# Patient Record
Sex: Female | Born: 1970 | Race: Black or African American | Hispanic: No | Marital: Single | Smoking: Never smoker
Health system: Southern US, Community
[De-identification: ages and names within clinical notes are randomized; demographics above are authoritative.]

## PROBLEM LIST (undated history)

## (undated) ENCOUNTER — Ambulatory Visit: Discharge status: Absent without leave | Payer: 59

## (undated) DIAGNOSIS — IMO0002 Reserved for concepts with insufficient information to code with codable children: Secondary | ICD-10-CM

## (undated) DIAGNOSIS — A630 Anogenital (venereal) warts: Secondary | ICD-10-CM

## (undated) DIAGNOSIS — N750 Cyst of Bartholin's gland: Secondary | ICD-10-CM

## (undated) DIAGNOSIS — K589 Irritable bowel syndrome without diarrhea: Secondary | ICD-10-CM

## (undated) DIAGNOSIS — L709 Acne, unspecified: Secondary | ICD-10-CM

## (undated) HISTORY — DX: Reserved for concepts with insufficient information to code with codable children: IMO0002

## (undated) HISTORY — DX: Irritable bowel syndrome, unspecified: K58.9

## (undated) HISTORY — DX: Anogenital (venereal) warts: A63.0

## (undated) HISTORY — DX: Cyst of Bartholin's gland: N75.0

## (undated) HISTORY — DX: Acne, unspecified: L70.9

---

## 1997-11-02 HISTORY — PX: TUBAL LIGATION: SHX77

## 1998-05-22 ENCOUNTER — Encounter: Admission: RE | Admit: 1998-05-22 | Discharge: 1998-05-22 | Payer: Self-pay | Admitting: *Deleted

## 2003-11-03 HISTORY — PX: ENDOMETRIAL ABLATION: SHX621

## 2004-03-21 ENCOUNTER — Ambulatory Visit (HOSPITAL_COMMUNITY): Admission: RE | Admit: 2004-03-21 | Discharge: 2004-03-21 | Payer: Self-pay | Admitting: Obstetrics & Gynecology

## 2004-03-21 ENCOUNTER — Other Ambulatory Visit: Admission: RE | Admit: 2004-03-21 | Discharge: 2004-03-21 | Payer: Self-pay | Admitting: Obstetrics & Gynecology

## 2006-04-06 ENCOUNTER — Other Ambulatory Visit: Admission: RE | Admit: 2006-04-06 | Discharge: 2006-04-06 | Payer: Self-pay | Admitting: Gynecology

## 2007-04-12 ENCOUNTER — Other Ambulatory Visit: Admission: RE | Admit: 2007-04-12 | Discharge: 2007-04-12 | Payer: Self-pay | Admitting: Gynecology

## 2007-08-01 ENCOUNTER — Emergency Department (HOSPITAL_COMMUNITY): Admission: EM | Admit: 2007-08-01 | Discharge: 2007-08-01 | Payer: Self-pay | Admitting: Emergency Medicine

## 2007-09-03 DIAGNOSIS — A630 Anogenital (venereal) warts: Secondary | ICD-10-CM

## 2007-09-03 HISTORY — DX: Anogenital (venereal) warts: A63.0

## 2007-10-21 ENCOUNTER — Other Ambulatory Visit: Admission: RE | Admit: 2007-10-21 | Discharge: 2007-10-21 | Payer: Self-pay | Admitting: Gynecology

## 2008-03-15 HISTORY — PX: FLEXIBLE SIGMOIDOSCOPY: SHX1649

## 2008-04-13 ENCOUNTER — Other Ambulatory Visit: Admission: RE | Admit: 2008-04-13 | Discharge: 2008-04-13 | Payer: Self-pay | Admitting: Gynecology

## 2008-04-22 ENCOUNTER — Emergency Department (HOSPITAL_COMMUNITY): Admission: EM | Admit: 2008-04-22 | Discharge: 2008-04-22 | Payer: Self-pay | Admitting: Emergency Medicine

## 2008-08-17 ENCOUNTER — Ambulatory Visit: Payer: Self-pay | Admitting: Gynecology

## 2008-10-18 ENCOUNTER — Other Ambulatory Visit: Admission: RE | Admit: 2008-10-18 | Discharge: 2008-10-18 | Payer: Self-pay | Admitting: Gynecology

## 2008-10-18 ENCOUNTER — Ambulatory Visit: Payer: Self-pay | Admitting: Gynecology

## 2009-04-17 ENCOUNTER — Ambulatory Visit: Payer: Self-pay | Admitting: Gynecology

## 2009-04-17 ENCOUNTER — Encounter: Payer: Self-pay | Admitting: Gynecology

## 2009-04-17 ENCOUNTER — Other Ambulatory Visit: Admission: RE | Admit: 2009-04-17 | Discharge: 2009-04-17 | Payer: Self-pay | Admitting: Gynecology

## 2009-05-20 ENCOUNTER — Ambulatory Visit: Payer: Self-pay | Admitting: Gynecology

## 2010-04-02 DIAGNOSIS — IMO0002 Reserved for concepts with insufficient information to code with codable children: Secondary | ICD-10-CM

## 2010-04-02 DIAGNOSIS — R87619 Unspecified abnormal cytological findings in specimens from cervix uteri: Secondary | ICD-10-CM

## 2010-04-02 HISTORY — DX: Reserved for concepts with insufficient information to code with codable children: IMO0002

## 2010-04-02 HISTORY — DX: Unspecified abnormal cytological findings in specimens from cervix uteri: R87.619

## 2010-04-22 ENCOUNTER — Other Ambulatory Visit: Admission: RE | Admit: 2010-04-22 | Discharge: 2010-04-22 | Payer: Self-pay | Admitting: Gynecology

## 2010-04-22 ENCOUNTER — Ambulatory Visit: Payer: Self-pay | Admitting: Gynecology

## 2011-03-20 NOTE — Op Note (Signed)
[name], [name]                           ACCOUNT NO.:  [id number]   MEDICAL RECORD NO.:  [id number]                   PATIENT TYPE:  AMB   LOCATION:  DAY                                  FACILITY:  [facility]   PHYSICIAN:  Lazaro Arms, M.D.                DATE OF BIRTH:  [birth year]   DATE OF PROCEDURE:  03/21/2004  DATE OF DISCHARGE:                                 OPERATIVE REPORT   PREOPERATIVE DIAGNOSES:  1. Menometrorrhagia.  2. Dysmenorrhea.   POSTOPERATIVE DIAGNOSES:  1. Menometrorrhagia.  2. Dysmenorrhea.   PROCEDURE:  1. Hysteroscopy.  2. Dilatation and curettage.  3. Endometrial ablation.   SURGEON:  Lazaro Arms, M.D.   ANESTHESIA:  General endotracheal anesthesia.   FINDINGS:  The patient has an essentially normal endometrial cavity.   DESCRIPTION OF OPERATION:  The patient was taken to the operating room and  placed in the supine position and underwent laryngeal mask airway, placed in  the dorsal lithotomy position and prepped and draped in the usual sterile  fashion.  A Graves speculum was placed, paracervical block placed. The  cervix was grasped with a single-tooth tenaculum. The cervix was dilated  serially to allow passage of the hysteroscope.  Hysteroscopy was performed  without difficulty.  There were no abnormalities of the endometrial cavity.   The hysteroscope was then removed.  The thermal ablation using the Gynecare  Thermachoice-3 was used.  It took 35 cc to maintain adequate fluid.  Total  therapy time was 12 minutes and 8 seconds.  The patient tolerated the  procedure well.  She experienced minimal blood loss; taken to the recovery  room in good stable condition.  She did have a small laceration from the  tenaculum on the left side and it was sutured with a figure-of-eight #0  Vicryl suture.  The patient tolerated the procedure well.      ___________________________________________                                            Lazaro Arms, M.D.   LHE/MEDQ  D:  03/21/2004  T:  03/21/2004  Job:  [id number]

## 2011-10-30 ENCOUNTER — Ambulatory Visit (INDEPENDENT_AMBULATORY_CARE_PROVIDER_SITE_OTHER): Payer: 59 | Admitting: Gynecology

## 2011-10-30 ENCOUNTER — Encounter: Payer: Self-pay | Admitting: Anesthesiology

## 2011-10-30 ENCOUNTER — Encounter: Payer: Self-pay | Admitting: Gynecology

## 2011-10-30 DIAGNOSIS — N898 Other specified noninflammatory disorders of vagina: Secondary | ICD-10-CM

## 2011-10-30 DIAGNOSIS — B373 Candidiasis of vulva and vagina: Secondary | ICD-10-CM

## 2011-10-30 DIAGNOSIS — Z113 Encounter for screening for infections with a predominantly sexual mode of transmission: Secondary | ICD-10-CM

## 2011-10-30 MED ORDER — FLUCONAZOLE 150 MG PO TABS: 150.0000 mg | ORAL_TABLET | Freq: Once | ORAL | Status: AC

## 2011-10-30 NOTE — Progress Notes (Signed)
Addended byCammie Mcgee T on: 10/30/2011 02:45 PM   Modules accepted: Orders

## 2011-10-30 NOTE — Progress Notes (Signed)
Patient presents with a one to two-week history of vaginal discharge. No odor or itching. Her partner was given a prescription for metronidazole to treat some unknown issue she did some reading was worried that she has an STD.  Exam with chaperone Elane Fritz present External BUS vagina with white discharge. Cervix normal. Uterus normal size midline mobile nontender. Adnexa without masses or tenderness.   Assessment and plan. Wet prep is positive for yeast. We'll treat with Diflucan 150x1 dose. GC Chlamydia screen was done. Reviewed serum STD screening and she wants to go ahead and do this I ordered an RPR, HIV, hepatitis B and hepatitis C. Patient will follow up with these results. In review of her chart her last exam here was June 2011. She reports having a checkup at [facility] in June 2012 with a normal Pap smear.

## 2011-10-30 NOTE — Patient Instructions (Signed)
Follow up for STD screening results. Take Diflucan as prescribed.

## 2011-11-02 LAB — GC/CHLAMYDIA PROBE AMP, GENITAL
Chlamydia, DNA Probe: NEGATIVE
GC Probe Amp, Genital: NEGATIVE

## 2011-11-04 ENCOUNTER — Telehealth: Payer: Self-pay | Admitting: *Deleted

## 2011-11-04 MED ORDER — TERCONAZOLE 0.4 % VA CREA: 1.0000 | TOPICAL_CREAM | Freq: Every day | VAGINAL | Status: AC

## 2011-11-04 NOTE — Telephone Encounter (Signed)
Patient informed rx sent in. 

## 2011-11-04 NOTE — Telephone Encounter (Signed)
Please call patient and informed GC/Chlamydia culture negative. Also call in Terazol 7 one applicator at bedtime x7. Call if no relief.

## 2011-11-04 NOTE — Telephone Encounter (Signed)
Pt calling c/o yeast infection still there. Pt was seen on 10/30/11 for same issue and given diflucan 150 mg #1. Pt having white discharge with itching. Pt would like something to relieve symptoms. Please advise

## 2012-02-24 ENCOUNTER — Telehealth: Payer: Self-pay | Admitting: *Deleted

## 2012-02-24 NOTE — Telephone Encounter (Signed)
Pt called c/o BV and yeast s/s, left message on pt voicemail to make appointment.

## 2012-04-26 ENCOUNTER — Ambulatory Visit (INDEPENDENT_AMBULATORY_CARE_PROVIDER_SITE_OTHER): Payer: 59 | Admitting: Gynecology

## 2012-04-26 ENCOUNTER — Other Ambulatory Visit (HOSPITAL_COMMUNITY)
Admission: RE | Admit: 2012-04-26 | Discharge: 2012-04-26 | Disposition: A | Discharge status: Home / Self Care | Payer: 59 | Source: Ambulatory Visit | Attending: Gynecology | Admitting: Gynecology

## 2012-04-26 ENCOUNTER — Encounter: Payer: Self-pay | Admitting: Gynecology

## 2012-04-26 VITALS — BP 120/74 | Ht 66.0 in | Wt 165.0 lb

## 2012-04-26 DIAGNOSIS — Z01419 Encounter for gynecological examination (general) (routine) without abnormal findings: Secondary | ICD-10-CM | POA: Insufficient documentation

## 2012-04-26 DIAGNOSIS — Z113 Encounter for screening for infections with a predominantly sexual mode of transmission: Secondary | ICD-10-CM

## 2012-04-26 DIAGNOSIS — Z1159 Encounter for screening for other viral diseases: Secondary | ICD-10-CM | POA: Insufficient documentation

## 2012-04-26 DIAGNOSIS — Z131 Encounter for screening for diabetes mellitus: Secondary | ICD-10-CM

## 2012-04-26 DIAGNOSIS — Z13220 Encounter for screening for lipoid disorders: Secondary | ICD-10-CM

## 2012-04-26 LAB — CBC WITH DIFFERENTIAL/PLATELET
Basophils Absolute: 0 10*3/uL (ref 0.0–0.1)
Basophils Relative: 0 % (ref 0–1)
Hemoglobin: 14.9 g/dL (ref 12.0–15.0)
MCHC: 34.7 g/dL (ref 30.0–36.0)
Neutro Abs: 6.3 10*3/uL (ref 1.7–7.7)
Neutrophils Relative %: 71 % (ref 43–77)
RDW: 13.5 % (ref 11.5–15.5)

## 2012-04-26 LAB — GLUCOSE, RANDOM: Glucose, Bld: 72 mg/dL (ref 70–99)

## 2012-04-26 LAB — LIPID PANEL: Cholesterol: 162 mg/dL (ref 0–200)

## 2012-04-26 NOTE — Patient Instructions (Signed)
Office will contact you with lab results. Follow up in one year for annual gynecologic exam.

## 2012-04-26 NOTE — Progress Notes (Signed)
MALANIE KOLOSKI 1971-03-28 [id number]        41 y.o.  for annual exam.  Doing well without complaints.  Past medical history,surgical history, medications, allergies, family history and social history were all reviewed and documented in the EPIC chart. ROS:  Was performed and pertinent positives and negatives are included in the history.  Exam: Sherrilyn Rist assistant present Filed Vitals:   04/26/12 1543  BP: 120/74   General appearance  Normal Skin grossly normal Head/Neck normal with no cervical or supraclavicular adenopathy thyroid normal Lungs  clear Cardiac RR, without RMG Abdominal  soft, nontender, without masses, organomegaly or hernia Breasts  examined lying and sitting without masses, retractions, discharge or axillary adenopathy. Pelvic  Ext/BUS/vagina  normal   Cervix  normal Pap/HPV  Uterus  anteverted, normal size, shape and contour, midline and mobile nontender   Adnexa  Without masses or tenderness    Anus and perineum  normal   Rectovaginal  normal sphincter tone without palpated masses or tenderness.    Assessment/Plan:  41 y.o. female for annual exam.   History of endometrial ablation was light regular menses status post tubal ligation. 1. Pap smear. Pap/HPV done today. 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 Pap smear 2009 LGSIL, follow up Pap 2009 negative, 2010 LGSIL, 2011 negative, no Pap 2012. If Pap/HPV negative today plan repeat in 1 year. If otherwise then we'll triage based on results. 2. Mammography. Patient planning baseline mammogram by age 46.  SBE monthly reviewed. 3. HIV screening. Patient requests HIV screening. No known exposure but wants to be screened. Offered hepatitis B hepatitis C RPR and she declined. Recent GC Chlamydia screen in December negative, reoffered now she declined. 4. Health maintenance. Baseline CBC glucose lipid profile urinalysis ordered. Assuming she continues well from a gynecologic standpoint she will see me in a year, sooner as  needed.    Dara Lords MD, 4:44 PM 04/26/2012

## 2012-04-27 LAB — URINALYSIS W MICROSCOPIC + REFLEX CULTURE
Bilirubin Urine: NEGATIVE
Crystals: NONE SEEN
Glucose, UA: NEGATIVE mg/dL
Specific Gravity, Urine: 1.006 (ref 1.005–1.030)
Squamous Epithelial / LPF: NONE SEEN

## 2012-07-20 ENCOUNTER — Ambulatory Visit (INDEPENDENT_AMBULATORY_CARE_PROVIDER_SITE_OTHER): Payer: 59 | Admitting: Women's Health

## 2012-07-20 ENCOUNTER — Encounter: Payer: Self-pay | Admitting: Women's Health

## 2012-07-20 DIAGNOSIS — Z9889 Other specified postprocedural states: Secondary | ICD-10-CM | POA: Insufficient documentation

## 2012-07-20 DIAGNOSIS — N898 Other specified noninflammatory disorders of vagina: Secondary | ICD-10-CM

## 2012-07-20 DIAGNOSIS — N879 Dysplasia of cervix uteri, unspecified: Secondary | ICD-10-CM

## 2012-07-20 LAB — WET PREP FOR TRICH, YEAST, CLUE: Trich, Wet Prep: NONE SEEN

## 2012-07-20 MED ORDER — FLUCONAZOLE 150 MG PO TABS: 150.0000 mg | ORAL_TABLET | Freq: Once | ORAL | Status: DC

## 2012-07-20 NOTE — Patient Instructions (Signed)
Monilial Vaginitis Vaginitis in a soreness, swelling and redness (inflammation) of the vagina and vulva. Monilial vaginitis is not a sexually transmitted infection. CAUSES  Yeast vaginitis is caused by yeast (candida) that is normally found in your vagina. With a yeast infection, the candida has overgrown in number to a point that upsets the chemical balance. SYMPTOMS   White, thick vaginal discharge.   Swelling, itching, redness and irritation of the vagina and possibly the lips of the vagina (vulva).   Burning or painful urination.   Painful intercourse.  DIAGNOSIS  Things that may contribute to monilial vaginitis are:  Postmenopausal and virginal states.   Pregnancy.   Infections.   Being tired, sick or stressed, especially if you had monilial vaginitis in the past.   Diabetes. Good control will help lower the chance.   Birth control pills.   Tight fitting garments.   Using bubble bath, feminine sprays, douches or deodorant tampons.   Taking certain medications that kill germs (antibiotics).   Sporadic recurrence can occur if you become ill.  TREATMENT  Your caregiver will give you medication.  There are several kinds of anti monilial vaginal creams and suppositories specific for monilial vaginitis. For recurrent yeast infections, use a suppository or cream in the vagina 2 times a week, or as directed.   Anti-monilial or steroid cream for the itching or irritation of the vulva may also be used. Get your caregiver's permission.   Painting the vagina with methylene blue solution may help if the monilial cream does not work.   Eating yogurt may help prevent monilial vaginitis.  HOME CARE INSTRUCTIONS   Finish all medication as prescribed.   Do not have sex until treatment is completed or after your caregiver tells you it is okay.   Take warm sitz baths.   Do not douche.   Do not use tampons, especially scented ones.   Wear cotton underwear.   Avoid tight  pants and panty hose.   Tell your sexual partner that you have a yeast infection. They should go to their caregiver if they have symptoms such as mild rash or itching.   Your sexual partner should be treated as well if your infection is difficult to eliminate.   Practice safer sex. Use condoms.   Some vaginal medications cause latex condoms to fail. Vaginal medications that harm condoms are:   Cleocin cream.   Butoconazole (Femstat).   Terconazole (Terazol) vaginal suppository.   Miconazole (Monistat) (may be purchased over the counter).  SEEK MEDICAL CARE IF:   You have a temperature by mouth above 102 F (38.9 C).   The infection is getting worse after 2 days of treatment.   The infection is not getting better after 3 days of treatment.   You develop blisters in or around your vagina.   You develop vaginal bleeding, and it is not your menstrual period.   You have pain when you urinate.   You develop intestinal problems.   You have pain with sexual intercourse.  Document Released: 07/29/2005 Document Revised: 10/08/2011 Document Reviewed: 04/12/2009 ExitCare Patient Information 2012 [employer]. 

## 2012-07-20 NOTE — Progress Notes (Signed)
Patient ID: Annette Larsen, female   DOB: 1971/10/24, 41 y.o.   MRN: [id number] Presents with complaint of increased white vaginal discharge for several days. Denies vaginal itching or odor. Recent breakup with long-term partner, denies infidelity. Denies urinary symptoms or fever. History of BTL and ablation, monthly light cycle.  Exam: External genitalia within normal limits, speculum exam copious white discharge wet prep positive for yeast, GC Chlamydia culture taken/pending. Bimanual no CMT or adnexal fullness or tenderness.  Yeast  Plan: Diflucan 150 by mouth times one dose, prescription, proper use reviewed instructed to call if no relief of discharge.

## 2012-07-21 LAB — GC/CHLAMYDIA PROBE AMP, GENITAL
Chlamydia, DNA Probe: NEGATIVE
GC Probe Amp, Genital: NEGATIVE

## 2013-03-20 DIAGNOSIS — N760 Acute vaginitis: Secondary | ICD-10-CM | POA: Insufficient documentation

## 2013-03-20 DIAGNOSIS — R300 Dysuria: Secondary | ICD-10-CM | POA: Insufficient documentation

## 2013-03-24 ENCOUNTER — Ambulatory Visit (INDEPENDENT_AMBULATORY_CARE_PROVIDER_SITE_OTHER): Payer: 59 | Admitting: Gynecology

## 2013-03-24 VITALS — BP 112/72

## 2013-03-24 DIAGNOSIS — L739 Follicular disorder, unspecified: Secondary | ICD-10-CM

## 2013-03-24 DIAGNOSIS — L738 Other specified follicular disorders: Secondary | ICD-10-CM

## 2013-03-24 NOTE — Progress Notes (Signed)
42 year old who presented to the office today stating that she noted a "bump" in her vulvar region near the mons pubis. Patient's having normal menstrual cycles. Patient has had previous tubal sterilization procedure and is in a monogamous relationship.  Exam: Mons pubis the lower aspect near the right labia majora a small follicular cyst was noted. Nontender and nonerythematous.  Bartholin urethra Skene glands: Within normal limits Vagina: No lesions or discharge Cervix: No lesions or discharge Uterus: Not examined Adnexa: Not examined Rectal exam: Not examined  Assessment/plan:vulvar folliculitis noninfected patient reassured that with time this will resolve by itself. After bathing  she can apply Neosporin. If this area increases  in size or becomes red or tender she will return  to the office in the event that this were to turn into a follicular abscess which may need to be incised and drained then.

## 2013-03-24 NOTE — Patient Instructions (Addendum)
Folliculitis  Folliculitis is redness, soreness, and swelling (inflammation) of the hair follicles. This condition can occur anywhere on the body. People with weakened immune systems, diabetes, or obesity have a greater risk of getting folliculitis. CAUSES  Bacterial infection. This is the most common cause.  Fungal infection.  Viral infection.  Contact with certain chemicals, especially oils and tars. Long-term folliculitis can result from bacteria that live in the nostrils. The bacteria may trigger multiple outbreaks of folliculitis over time. SYMPTOMS Folliculitis most commonly occurs on the scalp, thighs, legs, back, buttocks, and areas where hair is shaved frequently. An early sign of folliculitis is a small, white or yellow, pus-filled, itchy lesion (pustule). These lesions appear on a red, inflamed follicle. They are usually less than 0.2 inches (5 mm) wide. When there is an infection of the follicle that goes deeper, it becomes a boil or furuncle. A group of closely packed boils creates a larger lesion (carbuncle). Carbuncles tend to occur in hairy, sweaty areas of the body. DIAGNOSIS  Your caregiver can usually tell what is wrong by doing a physical exam. A sample may be taken from one of the lesions and tested in a lab. This can help determine what is causing your folliculitis. TREATMENT  Treatment may include:  Applying warm compresses to the affected areas.  Taking antibiotic medicines orally or applying them to the skin.  Draining the lesions if they contain a large amount of pus or fluid.  Laser hair removal for cases of long-lasting folliculitis. This helps to prevent regrowth of the hair. HOME CARE INSTRUCTIONS  Apply warm compresses to the affected areas as directed by your caregiver.  If antibiotics are prescribed, take them as directed. Finish them even if you start to feel better.  You may take over-the-counter medicines to relieve itching.  Do not shave  irritated skin.  Follow up with your caregiver as directed. SEEK IMMEDIATE MEDICAL CARE IF:   You have increasing redness, swelling, or pain in the affected area.  You have a fever. MAKE SURE YOU:  Understand these instructions.  Will watch your condition.  Will get help right away if you are not doing well or get worse. Document Released: 12/28/2001 Document Revised: 04/19/2012 Document Reviewed: 01/19/2012 ExitCare Patient Information 2014 [employer].  

## 2013-05-09 ENCOUNTER — Encounter: Payer: 59 | Admitting: Gynecology

## 2013-05-18 ENCOUNTER — Other Ambulatory Visit: Payer: Self-pay | Admitting: Obstetrics and Gynecology

## 2013-05-18 ENCOUNTER — Ambulatory Visit (INDEPENDENT_AMBULATORY_CARE_PROVIDER_SITE_OTHER): Payer: 59 | Admitting: Obstetrics and Gynecology

## 2013-05-18 ENCOUNTER — Encounter: Payer: Self-pay | Admitting: Obstetrics and Gynecology

## 2013-05-18 VITALS — BP 124/70 | HR 64 | Ht 66.0 in | Wt 170.0 lb

## 2013-05-18 DIAGNOSIS — Z1231 Encounter for screening mammogram for malignant neoplasm of breast: Secondary | ICD-10-CM

## 2013-05-18 DIAGNOSIS — Z01419 Encounter for gynecological examination (general) (routine) without abnormal findings: Secondary | ICD-10-CM

## 2013-05-18 DIAGNOSIS — Z113 Encounter for screening for infections with a predominantly sexual mode of transmission: Secondary | ICD-10-CM

## 2013-05-18 DIAGNOSIS — E559 Vitamin D deficiency, unspecified: Secondary | ICD-10-CM

## 2013-05-18 DIAGNOSIS — Z0000 Encounter for general adult medical examination without abnormal findings: Secondary | ICD-10-CM

## 2013-05-18 NOTE — Patient Instructions (Signed)

## 2013-05-18 NOTE — Progress Notes (Signed)
Patient ID: Annette Larsen, female   DOB: 1971/03/07, 42 y.o.   MRN: [id number] 42 y.o.   Single    African American   female   G2P2   here for annual exam and STD testing. Patient states she is concerned about exposure based on the appearance of her boyfriend's genital region.  Had oral sex. Patient also had a bump on her bottom that was a boil that popped and blood came out.    Believes she has a history of fever blisters as child.  Denies vaginal discharge.   No throat pain currently.   No fevers.    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 Pap smear 2009 LGSIL, follow up Pap 2009 negative, 2010 LGSIL, 2011 negative, no Pap 2012 Pap 2013 - negative and negative high risk HPV.  Menses very light.  Menses regular.  Some back pain prior to menses, manageable.     Told she has a low vitamin D level and did not have follow up of this.  Patient's last menstrual period was 04/26/2013.          Sexually active: yes  The current method of family planning is tubal ligation.    Exercising: running, aerobics and weights Last mammogram:  never Last pap smear: 04/2012 wnl and negative high risk HPV History of abnormal pap:  Yes 7829,5621 LSIL: no treatment on cervix, only repeat paps which reverted back to normal.  Smoking: no Alcohol: no Last colonoscopy: never Last Bone Density:  never Last tetanus shot: 2011 Last cholesterol check: 2013 wnl, PCP - Cami Fulp, [place].  Hgb:                Urine: Neg   Family History  Problem Relation Age of Onset  . Diabetes Brother 7    died with complications from diabetes  . Hypertension Father   . Heart disease Father   . Diabetes Mother   . Migraines Mother     Patient Active Problem List   Diagnosis Date Noted  . Folliculitis 03/24/2013  . S/P endometrial ablation 07/20/2012  . Cervical dysplasia 07/20/2012    Past Medical History  Diagnosis Date  . IBS (irritable bowel syndrome)   . Acne   . Condyloma 09/2007  . LGSIL (low grade  squamous intraepithelial dysplasia)     04/2007, 04/2009,   . Benign cellular changes seen on Pap smear 04/2010  . Acne     Past Surgical History  Procedure Laterality Date  . Tubal ligation  1999  . Endometrial ablation  2005  . Flexible sigmoidoscopy  03/15/2008    Allergies: Review of patient's allergies indicates no known allergies.  Current Outpatient Prescriptions  Medication Sig Dispense Refill  . spironolactone (ALDACTONE) 50 MG tablet Take 50 mg by mouth daily.         No current facility-administered medications for this visit.    ROS: Pertinent items are noted in HPI.  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  Divorced.  Has  A stable relationship for the last 7 years.  Exam:    BP 124/70  Pulse 64  Ht 5\' 6"  (1.676 m)  Wt 170 lb (77.111 kg)  BMI 27.45 kg/m2  LMP 04/26/2013   Wt Readings from Last 3 Encounters:  05/18/13 170 lb (77.111 kg)  04/26/12 165 lb (74.844 kg)     Ht Readings from Last 3 Encounters:  05/18/13 5\' 6"  (1.676 m)  04/26/12 5\' 6"  (1.676 m)  General appearance: alert, cooperative and appears stated age Head: Normocephalic, without obvious abnormality, atraumatic Neck: no adenopathy, supple, symmetrical, trachea midline and thyroid not enlarged, symmetric, no tenderness/mass/nodules Lungs: clear to auscultation bilaterally Breasts: Inspection negative, No nipple retraction or dimpling, No nipple discharge or bleeding, No axillary or supraclavicular adenopathy, Normal to palpation without dominant masses Heart: regular rate and rhythm Abdomen: soft, non-tender; no masses,  no organomegaly Extremities: extremities normal, atraumatic, no cyanosis or edema Skin: Skin color, texture, turgor normal. No rashes or lesions Lymph nodes: Cervical, supraclavicular, and axillary nodes normal. No abnormal inguinal nodes palpated Neurologic: Grossly normal   Pelvic: External genitalia:  no lesions              Urethra:  normal  appearing urethra with no masses, tenderness or lesions              Bartholins and Skenes: normal                 Vagina: normal appearing vagina with normal color and discharge, no lesions              Cervix: normal appearance              Pap taken: yes and high risk HPV        Bimanual Exam:  Uterus:  uterus is normal size, shape, consistency and nontender                                      Adnexa: normal adnexa in size, nontender and no masses                                      Rectovaginal: Confirms                                      Anus:  normal sphincter tone, no lesions  A: normal menopausal exam Encouraged self breast exam. History of cervical dysplasia Status post endometrial ablation and BTL. Desire for STD testing Vitamin D deficiency  P: [facility] at [facility].  Patient will call to schedule. pap smear and high risk HPV GC/CT from pap, HIV, RPR, Hep C aby, HBsAg, HSV I and II IgM and IgG. Discussed female condom use. Vit D level return annually or prn     An After Visit Summary was printed and given to the patient.

## 2013-05-19 ENCOUNTER — Encounter: Payer: 59 | Admitting: Gynecology

## 2013-05-19 LAB — VITAMIN D 25 HYDROXY (VIT D DEFICIENCY, FRACTURES): Vit D, 25-Hydroxy: 87 ng/mL (ref 30–89)

## 2013-05-19 LAB — HSV(HERPES SIMPLEX VRS) I + II AB-IGG: HSV 2 Glycoprotein G Ab, IgG: <0.1 IV

## 2013-05-19 LAB — STD PANEL: HIV: NONREACTIVE

## 2013-05-22 ENCOUNTER — Telehealth: Payer: Self-pay | Admitting: Obstetrics and Gynecology

## 2013-05-22 NOTE — Telephone Encounter (Signed)
Patient has questions about results on MY Chart.

## 2013-05-23 NOTE — Telephone Encounter (Signed)
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 Pt reports she had a fever blister as a child, and was wondering if that had something to do with it. Advised that most likely made the result positive, and that HSV I was very common. Pt agreeable.

## 2013-05-25 LAB — HSV 1 AND 2 IGM ABS, INDIRECT
HSV 1 IgM Abs: NEGATIVE
HSV 2 IgM Abs: NEGATIVE

## 2013-06-19 ENCOUNTER — Ambulatory Visit
Admission: RE | Admit: 2013-06-19 | Discharge: 2013-06-19 | Disposition: A | Discharge status: Home / Self Care | Payer: 59 | Source: Ambulatory Visit | Attending: Obstetrics and Gynecology | Admitting: Obstetrics and Gynecology

## 2013-06-19 DIAGNOSIS — Z1231 Encounter for screening mammogram for malignant neoplasm of breast: Secondary | ICD-10-CM

## 2013-06-21 ENCOUNTER — Other Ambulatory Visit: Payer: Self-pay | Admitting: Obstetrics and Gynecology

## 2013-06-21 DIAGNOSIS — R928 Other abnormal and inconclusive findings on diagnostic imaging of breast: Secondary | ICD-10-CM

## 2013-07-04 ENCOUNTER — Ambulatory Visit
Admission: RE | Admit: 2013-07-04 | Discharge: 2013-07-04 | Disposition: A | Discharge status: Home / Self Care | Payer: 59 | Source: Ambulatory Visit | Attending: Obstetrics and Gynecology | Admitting: Obstetrics and Gynecology

## 2013-07-04 DIAGNOSIS — R928 Other abnormal and inconclusive findings on diagnostic imaging of breast: Secondary | ICD-10-CM

## 2013-07-10 ENCOUNTER — Other Ambulatory Visit: Payer: 59

## 2013-09-07 ENCOUNTER — Other Ambulatory Visit: Payer: Self-pay

## 2013-09-26 ENCOUNTER — Ambulatory Visit (INDEPENDENT_AMBULATORY_CARE_PROVIDER_SITE_OTHER): Payer: 59 | Admitting: Nurse Practitioner

## 2013-09-26 ENCOUNTER — Encounter: Payer: Self-pay | Admitting: Nurse Practitioner

## 2013-09-26 VITALS — BP 114/68 | Temp 98.5°F | Resp 12 | Ht 66.0 in | Wt 172.0 lb

## 2013-09-26 DIAGNOSIS — B3731 Acute candidiasis of vulva and vagina: Secondary | ICD-10-CM

## 2013-09-26 DIAGNOSIS — R3915 Urgency of urination: Secondary | ICD-10-CM

## 2013-09-26 DIAGNOSIS — B373 Candidiasis of vulva and vagina: Secondary | ICD-10-CM

## 2013-09-26 LAB — POCT URINALYSIS DIPSTICK: pH, UA: 5

## 2013-09-26 MED ORDER — NITROFURANTOIN MONOHYD MACRO 100 MG PO CAPS: 100.0000 mg | ORAL_CAPSULE | Freq: Two times a day (BID) | ORAL | Status: DC

## 2013-09-26 MED ORDER — FLUCONAZOLE 150 MG PO TABS: 150.0000 mg | ORAL_TABLET | Freq: Once | ORAL | Status: DC

## 2013-09-26 NOTE — Progress Notes (Signed)
Subjective:     Patient ID: Annette Larsen, female   DOB: [birth year], 42 y.o.   MRN: [id number]  HPI   This 42 yo DAA Fe presents with vaginitis symptoms that started on Sunday. No itching, no odor.  Discharge is white and creamy. No recent antibiotics.  She then developed urinary symptoms of frequency that became worse today.  No dysuria, urgency.  No fever chills.  Last SA 2 weeks ago.  Same partner for 8 years. LMP normal 09/06/13.  Post BTL.   Review of Systems  Constitutional: Negative for fever, chills and fatigue.  HENT: Negative.   Respiratory: Negative.   Cardiovascular: Negative.   Gastrointestinal: Negative.  Negative for nausea, vomiting, abdominal pain and diarrhea.  Genitourinary: Positive for frequency and vaginal discharge.  Musculoskeletal: Negative.   Skin: Negative.   Neurological: Negative.   Psychiatric/Behavioral: Negative.        Objective:   Physical Exam  Constitutional: She is oriented to person, place, and time. She appears well-developed and well-nourished. No distress.  Abdominal: Soft. She exhibits no distension. There is no tenderness. There is no rebound and no guarding.  No flank pain  Genitourinary:  No lesions. Thick white vaginal discharge. No cervicitis. Wet Prep: PH: 3.5; NSS: no clue cells; KOH: + yeast.  Neurological: She is alert and oriented to person, place, and time.  Psychiatric: She has a normal mood and affect. Her behavior is normal. Judgment and thought content normal.       Assessment:     R/O UTI Yeast vaginitis    Plan:     Diflucan 150 mg X 2 with a refill Rx Macrobid 100 mg # 14 - she will hold med's unless needed later this week Will follow with urine C & S

## 2013-09-26 NOTE — Patient Instructions (Signed)
Monilial Vaginitis  Vaginitis in a soreness, swelling and redness (inflammation) of the vagina and vulva. Monilial vaginitis is not a sexually transmitted infection.  CAUSES   Yeast vaginitis is caused by yeast (candida) that is normally found in your vagina. With a yeast infection, the candida has overgrown in number to a point that upsets the chemical balance.  SYMPTOMS   · White, thick vaginal discharge.  · Swelling, itching, redness and irritation of the vagina and possibly the lips of the vagina (vulva).  · Burning or painful urination.  · Painful intercourse.  DIAGNOSIS   Things that may contribute to monilial vaginitis are:  · Postmenopausal and virginal states.  · Pregnancy.  · Infections.  · Being tired, sick or stressed, especially if you had monilial vaginitis in the past.  · Diabetes. Good control will help lower the chance.  · Birth control pills.  · Tight fitting garments.  · Using bubble bath, feminine sprays, douches or deodorant tampons.  · Taking certain medications that kill germs (antibiotics).  · Sporadic recurrence can occur if you become ill.  TREATMENT   Your caregiver will give you medication.  · There are several kinds of anti monilial vaginal creams and suppositories specific for monilial vaginitis. For recurrent yeast infections, use a suppository or cream in the vagina 2 times a week, or as directed.  · Anti-monilial or steroid cream for the itching or irritation of the vulva may also be used. Get your caregiver's permission.  · Painting the vagina with methylene blue solution may help if the monilial cream does not work.  · Eating yogurt may help prevent monilial vaginitis.  HOME CARE INSTRUCTIONS   · Finish all medication as prescribed.  · Do not have sex until treatment is completed or after your caregiver tells you it is okay.  · Take warm sitz baths.  · Do not douche.  · Do not use tampons, especially scented ones.  · Wear cotton underwear.  · Avoid tight pants and panty  hose.  · Tell your sexual partner that you have a yeast infection. They should go to their caregiver if they have symptoms such as mild rash or itching.  · Your sexual partner should be treated as well if your infection is difficult to eliminate.  · Practice safer sex. Use condoms.  · Some vaginal medications cause latex condoms to fail. Vaginal medications that harm condoms are:  · Cleocin cream.  · Butoconazole (Femstat®).  · Terconazole (Terazol®) vaginal suppository.  · Miconazole (Monistat®) (may be purchased over the counter).  SEEK MEDICAL CARE IF:   · You have a temperature by mouth above 102° F (38.9° C).  · The infection is getting worse after 2 days of treatment.  · The infection is not getting better after 3 days of treatment.  · You develop blisters in or around your vagina.  · You develop vaginal bleeding, and it is not your menstrual period.  · You have pain when you urinate.  · You develop intestinal problems.  · You have pain with sexual intercourse.  Document Released: 07/29/2005 Document Revised: 01/11/2012 Document Reviewed: 04/12/2009  ExitCare® Patient Information ©2014 [employer].

## 2013-09-27 NOTE — Progress Notes (Signed)
Encounter reviewed by Dr. Malasha Kleppe Silva.  

## 2013-10-03 ENCOUNTER — Telehealth: Payer: Self-pay | Admitting: *Deleted

## 2013-10-03 NOTE — Telephone Encounter (Signed)
Message copied by Luisa Dago on Tue Oct 03, 2013  2:52 PM ------      Message from: GRUBB, PATRICIA R      Created: Mon Oct 02, 2013  8:33 AM       Let patient know negative results.  She can stop meds if she had already started. ------

## 2013-10-03 NOTE — Telephone Encounter (Signed)
I have attempted to contact this patient by phone with the following results: left message to return my call on answering machine (home/mobile).  

## 2013-10-03 NOTE — Telephone Encounter (Signed)
Pt notified of results.  Pt voices understanding and is agreeable with plan. 

## 2013-10-07 DIAGNOSIS — B9562 Methicillin resistant Staphylococcus aureus infection as the cause of diseases classified elsewhere: Secondary | ICD-10-CM | POA: Insufficient documentation

## 2013-10-14 ENCOUNTER — Emergency Department (HOSPITAL_COMMUNITY)
Admission: EM | Admit: 2013-10-14 | Discharge: 2013-10-14 | Disposition: A | Discharge status: Home / Self Care | Payer: 59 | Source: Home / Self Care | Attending: Family Medicine | Admitting: Family Medicine

## 2013-10-14 DIAGNOSIS — J069 Acute upper respiratory infection, unspecified: Secondary | ICD-10-CM

## 2013-10-14 MED ORDER — NAPROXEN 500 MG PO TABS: 500.0000 mg | ORAL_TABLET | Freq: Two times a day (BID) | ORAL | Status: DC

## 2013-10-14 MED ORDER — AZITHROMYCIN 250 MG PO TABS: 250.0000 mg | ORAL_TABLET | Freq: Every day | ORAL | Status: DC

## 2013-10-14 NOTE — ED Provider Notes (Signed)
Annette Larsen is a 42 y.o. female who presents to Urgent Care today for one week of sore throat nonproductive cough rhonchorous breathing. She is to get herself Mucinex which seems to work well. She was getting better initially but then today felt feverish and had muscle aches and a sore neck. The neck pain is mild. She denies any radiating pain weakness or numbness. She denies any injury bowel bladder dysfunction or difficulty walking. She's not tried any new medications today. No new nasal discharge ear pain cough congestion abdominal pain dysuria or shortness of breath.   Past Medical History  Diagnosis Date  . IBS (irritable bowel syndrome)   . Acne   . Condyloma 09/2007  . LGSIL (low grade squamous intraepithelial dysplasia)     04/2007, 04/2009,   . Benign cellular changes seen on Pap smear 04/2010  . Acne    History  Substance Use Topics  . Smoking status: Never Smoker   . Smokeless tobacco: Never Used  . Alcohol Use: No   ROS as above Medications reviewed. No current facility-administered medications for this encounter.   Current Outpatient Prescriptions  Medication Sig Dispense Refill  . azithromycin (ZITHROMAX) 250 MG tablet Take 1 tablet (250 mg total) by mouth daily. Take first 2 tablets together, then 1 every day until finished.  6 tablet  0  . Cholecalciferol (VITAMIN D3) 5000 UNITS CAPS Take by mouth.      . Multiple Vitamins-Minerals (MULTIVITAMIN PO) Take by mouth.      . naproxen (NAPROSYN) 500 MG tablet Take 1 tablet (500 mg total) by mouth 2 (two) times daily.  30 tablet  0  . [DISCONTINUED] spironolactone (ALDACTONE) 50 MG tablet Take 50 mg by mouth daily.          Exam:  BP 135/69  Pulse 69  Temp(Src) 100 F (37.8 C) (Oral)  Resp 18  SpO2 100%  LMP 09/06/2013 Gen: Well NAD HEENT: EOMI,  MMM, posterior pharynx with mild erythema. Tympanic membranes are normal appearing bilaterally. Lungs: Normal work of breathing. CTABL Heart: RRR no MRG Abd: NABS,  Soft. NT, ND Exts: Non edematous BL  LE, warm and well perfused.   neck: Nontender to spinal midline. Tender to palpation bilateral cervical paraspinals. Normal neck range of motion. Negative Spurling's test. Upper extremity grip strength sensation and reflexes are intact and equal bilaterally.   Assessment and Plan: 42 y.o. female with viral illness. Plan for symptomatically management with Naprosyn. Additionally I have prescribed azithromycin for use the patient does not improve. Discussed warning signs or symptoms. Please see discharge instructions. Patient expresses understanding.      Rodolph Bong, MD 10/14/13 [phone]

## 2013-10-14 NOTE — ED Notes (Signed)
C/o cough and cold symptoms last week, tonight at the mall she felt very tired and had some neck pain, just over all has not felt well

## 2013-11-02 DIAGNOSIS — N750 Cyst of Bartholin's gland: Secondary | ICD-10-CM

## 2013-11-02 HISTORY — DX: Cyst of Bartholin's gland: N75.0

## 2013-11-15 ENCOUNTER — Telehealth: Payer: Self-pay | Admitting: Obstetrics and Gynecology

## 2013-11-15 NOTE — Telephone Encounter (Signed)
Patient calling wanting an appointment with Dr. Edward Jolly about, "My cooter seems to be shrinking?" Please assess as patient is not having any other symptoms.

## 2013-11-15 NOTE — Telephone Encounter (Signed)
Patient states that she has noticed over the last month that the appearance of her vagina is changing. States that her clitoris and labia majora appear to be "shrinking". Patient denies any other complaints. Denies pain or discharge. Advised that with age there are changes to vaginal area, but it may be best for office visit for evaluation. Patient is agreeable and appointment scheduled at patient's preference of time/date with Dr. Edward Jolly.

## 2013-11-20 ENCOUNTER — Ambulatory Visit (INDEPENDENT_AMBULATORY_CARE_PROVIDER_SITE_OTHER): Payer: 59 | Admitting: Obstetrics and Gynecology

## 2013-11-20 ENCOUNTER — Encounter: Payer: Self-pay | Admitting: Obstetrics and Gynecology

## 2013-11-20 VITALS — BP 110/68 | HR 65 | Resp 16 | Wt 177.0 lb

## 2013-11-20 DIAGNOSIS — L659 Nonscarring hair loss, unspecified: Secondary | ICD-10-CM

## 2013-11-20 DIAGNOSIS — N905 Atrophy of vulva: Secondary | ICD-10-CM

## 2013-11-20 NOTE — Progress Notes (Signed)
GYNECOLOGY PROBLEM VISIT  PCP: Cain Saupe, MD  Referring provider:   HPI: 43 y.o.  Divorced  [place]  female   G2P2 with Patient's last menstrual period was 10/30/2013.  Status post BTL. here for  GYN Problem (Clitoris ans Labia appear to be shrinking)  Patient states she noticed this a few weeks ago and describes this as a sudden onset. Patient's anatomy was normal in November 2014 according to her.  No steroid cream use. Partner not using any hormonal treatments. Denies symptoms of vaginal dryness.   Some weight gain, not weight loss.  Stopped spironolactone about 11 months ago.   Has had hair loss and fatigue.  Saw PCP and dermatology. Awaiting specialty consultation at [facility] for this.   History of laser hair removal of the face.  No chest hair.    GYNECOLOGIC HISTORY: Patient's last menstrual period was 10/30/2013. Sexually active:yes Partner preference: female Contraception:   BTL (1999) Menopausal hormone therapy: no DES exposure:  no  Blood transfusions:  no  Sexually transmitted diseases:  no GYN Procedures:  Endometrial Ablation Mammogram:     Yes  06/2013 normal            Pap:   05/2013 neg History of abnormal pap smear:  Yes 2012 (repeat pap every 6 months)   OB History   Grav Para Term Preterm Abortions TAB SAB Ect Mult Living   2 2        2          Family History  Problem Relation Age of Onset  . Diabetes Brother 63    died with complications from diabetes  . Hypertension Father   . Heart disease Father   . Diabetes Mother   . Migraines Mother     Patient Active Problem List   Diagnosis Date Noted  . Folliculitis 03/24/2013  . S/P endometrial ablation 07/20/2012  . Cervical dysplasia 07/20/2012    Past Medical History  Diagnosis Date  . IBS (irritable bowel syndrome)   . Acne   . Condyloma 09/2007  . LGSIL (low grade squamous intraepithelial dysplasia)     04/2007, 04/2009,   . Benign cellular changes seen on Pap smear 04/2010   . Acne     Past Surgical History  Procedure Laterality Date  . Tubal ligation  1999  . Endometrial ablation  2005  . Flexible sigmoidoscopy  03/15/2008    ALLERGIES: Review of patient's allergies indicates no known allergies.  Current Outpatient Prescriptions  Medication Sig Dispense Refill  . Cholecalciferol (VITAMIN D3) 5000 UNITS CAPS Take by mouth.      . Zinc Sulfate (ZINC 15 PO) Take by mouth.      . Multiple Vitamins-Minerals (MULTIVITAMIN PO) Take by mouth.      . [DISCONTINUED] spironolactone (ALDACTONE) 50 MG tablet Take 50 mg by mouth daily.         No current facility-administered medications for this visit.     ROS:  Pertinent items are noted in HPI.  SOCIAL HISTORY:  Steady partner.   PHYSICAL EXAMINATION:    BP 110/68  Pulse 65  Resp 16  Wt 177 lb (80.287 kg)  LMP 10/30/2013   Wt Readings from Last 3 Encounters:  11/20/13 177 lb (80.287 kg)  09/26/13 172 lb (78.019 kg)  05/18/13 170 lb (77.111 kg)     Ht Readings from Last 3 Encounters:  09/26/13 5\' 6"  (1.676 m)  05/18/13 5\' 6"  (1.676 m)  04/26/12 5\' 6"  (1.676 m)  General appearance: alert, cooperative and appears stated age Head: Normocephalic, without obvious abnormality, atraumatic Neck: no adenopathy, supple, symmetrical, trachea midline and thyroid not enlarged, symmetric, no tenderness/mass/nodules Lungs: clear to auscultation bilaterally. Heart: regular rate and rhythm Abdomen: soft, non-tender; no masses,  no organomegaly Neurologic: Grossly normal  Pelvic: External genitalia:  no lesions/.  Clitoris and labia have normal appearance.               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  PLAN  Counseled on atrophic changes of the vulva that occur with age. Patient reassured that her anatomy looks normal today. Will check testosterone, estradiol, and TSH level.  Return prn.    An After Visit Summary was printed and given to the patient.  25 minutes face to face time of which over 50% was spent in counseling.

## 2013-11-20 NOTE — Patient Instructions (Signed)
We will contact you with test results later this week.

## 2013-11-21 LAB — TSH: TSH: 1.814 u[IU]/mL (ref 0.350–4.500)

## 2013-11-21 LAB — TESTOSTERONE: Testosterone: 63 ng/dL (ref 10–70)

## 2013-11-21 LAB — ESTRADIOL: Estradiol: 179.1 pg/mL

## 2014-02-06 ENCOUNTER — Telehealth: Payer: Self-pay | Admitting: Obstetrics and Gynecology

## 2014-02-06 NOTE — Telephone Encounter (Signed)
Patient thinks she may have a "yeast infection" and requests Dr. Edward Jolly call Diflucan into her pharmacy close to work. Patient declined an appointment stating, "It's in my chart I get these fairly often and I don't think I should have to come in."  [facility]

## 2014-02-06 NOTE — Telephone Encounter (Signed)
Spoke with patient. Patient states that she has been experiencing some itching with white discharge that began two days ago. Denies fever, pain, odor, and any urinary symptoms. Patient states that this is a reoccurring issue and has come in to be treated before. Seen last for yeast related symptoms on 11/24 by Lauro Franklin, FNP. Was given Diflucan and Macrobid at 11/24 office visit. Advised we would like patient to come in for office visit so we would do a culture to make sure we are treating yeast. Patient states that she does not want to come in for office visit as she has been in before with this problem. Instructed we recommend that she try Monistat to relieve her symptoms. Advised to dry well after showering and to wear white cotton underwear as yeast thrives in warm damp environments. Patient states that Monistat does not relieve her symptoms and Diflucan usually gets rid of it right away. Advised would send message to provider and if any further instructions would call back.

## 2014-02-06 NOTE — Telephone Encounter (Signed)
Spoke with patient. Message from Dr.Silva given as seen below. Patient is agreeable and verbalizes understanding.  Routing to provider for final review. Patient agreeable to disposition. Will close encounter   

## 2014-02-06 NOTE — Telephone Encounter (Signed)
Have patient try over the counter Gyne-Lotrimin, which is different from Monistat.   If symptoms don't resolve, I recommend office visit.

## 2014-05-21 ENCOUNTER — Ambulatory Visit: Payer: 59 | Admitting: Obstetrics and Gynecology

## 2014-06-28 ENCOUNTER — Other Ambulatory Visit: Payer: Self-pay

## 2014-06-28 DIAGNOSIS — Z1231 Encounter for screening mammogram for malignant neoplasm of breast: Secondary | ICD-10-CM

## 2014-07-04 ENCOUNTER — Encounter: Payer: Self-pay | Admitting: Obstetrics and Gynecology

## 2014-07-04 ENCOUNTER — Ambulatory Visit (INDEPENDENT_AMBULATORY_CARE_PROVIDER_SITE_OTHER): Payer: 59 | Admitting: Obstetrics and Gynecology

## 2014-07-04 VITALS — BP 110/70 | HR 72 | Resp 16 | Ht 66.0 in | Wt 171.0 lb

## 2014-07-04 DIAGNOSIS — Z0000 Encounter for general adult medical examination without abnormal findings: Secondary | ICD-10-CM

## 2014-07-04 DIAGNOSIS — Z01419 Encounter for gynecological examination (general) (routine) without abnormal findings: Secondary | ICD-10-CM

## 2014-07-04 LAB — POCT URINALYSIS DIPSTICK
Bilirubin, UA: NEGATIVE
Blood, UA: NEGATIVE
GLUCOSE UA: NEGATIVE
KETONES UA: NEGATIVE
LEUKOCYTES UA: NEGATIVE
Nitrite, UA: NEGATIVE
PROTEIN UA: NEGATIVE
UROBILINOGEN UA: NEGATIVE
pH, UA: 5

## 2014-07-04 LAB — CBC
HCT: 41.8 % (ref 36.0–46.0)
Hemoglobin: 13.8 g/dL (ref 12.0–15.0)
MCH: 29.7 pg (ref 26.0–34.0)
MCHC: 33 g/dL (ref 30.0–36.0)
MCV: 89.9 fL (ref 78.0–100.0)
PLATELETS: 245 10*3/uL (ref 150–400)
RBC: 4.65 MIL/uL (ref 3.87–5.11)
RDW: 13.6 % (ref 11.5–15.5)
WBC: 6.5 10*3/uL (ref 4.0–10.5)

## 2014-07-04 LAB — HEMOGLOBIN, FINGERSTICK: HEMOGLOBIN, FINGERSTICK: 14 g/dL (ref 12.0–16.0)

## 2014-07-04 NOTE — Progress Notes (Signed)
Patient ID: Annette Larsen, female   DOB: 19-Jul-1971, 43 y.o.   MRN: [id number] GYNECOLOGY VISIT  PCP:   Referring provider:   HPI: 43 y.o.   Single African American female   G2P2 with Patient's last menstrual period was 07/04/2014.   here for  AEX.  Still thinks her clitoris is smaller than it used to be.  Had a normal testosterone and estradiol check in January 2015.  Hgb:    14.0 Urine:  Neg  GYNECOLOGIC HISTORY: Patient's last menstrual period was 07/04/2014. 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 Diagnostic right mammogram--scattered areas of fibroglandular density, no evidence of malignancy: recommend bilateral screening mammogram 06/2014: [facility].             Last pap and high risk HPV testing: 05-18-13 wnl:neg HR HPV   History of abnormal pap smear:  2008, 2011 LSIL:no treatment to cervix, only repeat paps which reverted to normal.    OB History   Grav Para Term Preterm Abortions TAB SAB Ect Mult Living   2 2        2        LIFESTYLE: Exercise:  Running/aerobics/weights             OTHER HEALTH MAINTENANCE: Tetanus/TDap:   2011 HPV:                   n/a Influenza:           10-/2014   Bone density:    never Colonoscopy:   never  Cholesterol check: 2013 wnl  Family History  Problem Relation Age of Onset  . Diabetes Brother 52    died with complications from diabetes  . Hypertension Father   . Heart disease Father   . Diabetes Mother   . Migraines Mother     Patient Active Problem List   Diagnosis Date Noted  . Folliculitis 03/24/2013  . S/P endometrial ablation 07/20/2012  . Cervical dysplasia 07/20/2012    Past Medical History  Diagnosis Date  . IBS (irritable bowel syndrome)   . Acne   . Condyloma 09/2007  . LGSIL (low grade squamous intraepithelial dysplasia)     04/2007, 04/2009,   . Benign cellular changes seen on Pap smear 04/2010  . Acne     Past Surgical History  Procedure Laterality Date  . Tubal ligation  1999  . Endometrial ablation  2005  . Flexible sigmoidoscopy  03/15/2008    ALLERGIES: Review of patient's allergies indicates no known allergies.  Current Outpatient Prescriptions  Medication Sig Dispense Refill  . Cholecalciferol (VITAMIN D3) 5000 UNITS CAPS Take by mouth.      . Multiple Vitamins-Minerals (MULTIVITAMIN PO) Take by mouth.      . Zinc Sulfate (ZINC 15 PO) Take by mouth.      . doxycycline (MONODOX) 100 MG capsule Take 100 mg by mouth.      . [DISCONTINUED] spironolactone (ALDACTONE) 50 MG tablet Take 50 mg by mouth daily.         No current facility-administered medications for this visit.     ROS:  Pertinent items are noted in HPI.  History   Social History  . Marital Status: Single  Spouse Name: N/A    Number of Children: N/A  . Years of Education: N/A   Occupational History  . Not on file.   Social History Main Topics  . Smoking status: Never Smoker   . Smokeless tobacco: Never Used  . Alcohol Use: Yes     Comment: occ glass of wine  . Drug Use: No  . Sexual Activity: Yes    Partners: Male    Birth Control/ Protection: Surgical     Comment: TUBAL LIGATION   Other Topics Concern  . Not on file   Social History Narrative  . No narrative on file    PHYSICAL EXAMINATION:    BP 110/70  Pulse 72  Resp 16  Ht  (1.676 m)  Wt 171 lb (77.565 kg)  BMI 27.61 kg/m2  LMP 07/04/2014   Wt Readings from Last 3 Encounters:  07/04/14 171 lb (77.565 kg)  11/20/13 177 lb (80.287 kg)  09/26/13 172 lb (78.019 kg)     Ht Readings from Last 3 Encounters:  07/04/14  (1.676 m)  09/26/13  (1.676 m)  05/18/13   (1.676 m)    General appearance: alert, cooperative and appears stated age Head: Normocephalic, without obvious abnormality, atraumatic Neck: no adenopathy, supple, symmetrical, trachea midline and thyroid not enlarged, symmetric, no tenderness/mass/nodules Lungs: clear to auscultation bilaterally Breasts: Inspection negative, No nipple retraction or dimpling, No nipple discharge or bleeding, No axillary or supraclavicular adenopathy, Normal to palpation without dominant masses Heart: regular rate and rhythm Abdomen: soft, non-tender; no masses,  no organomegaly Extremities: extremities normal, atraumatic, no cyanosis or edema Skin: Skin color, texture, turgor normal. No rashes or lesions Lymph nodes: Cervical, supraclavicular, and axillary nodes normal. No abnormal inguinal nodes palpated Neurologic: Grossly normal  Pelvic: External genitalia:  no lesions              Urethra:  normal appearing urethra with no masses, tenderness or lesions              Bartholins and Skenes: normal                 Vagina: normal appearing vagina with normal color and discharge, no lesions              Cervix: normal appearance              Pap and high risk HPV testing done: Yes.          Bimanual Exam:  Uterus:  uterus is normal size, shape, consistency and nontender                                      Adnexa: normal adnexa in size, nontender and no masses                                      Rectovaginal:  Yes.                                      Anus:  normal sphincter tone, no lesions  ASSESSMENT  Normal gynecologic exam. History of LGSIL. Status post BTL.  Status post endometrial ablation.  History of HSV 1.  PLAN  Mammogram recommended yearly starting at age  41. Pap smear and high risk HPV testing as above. Counseled on self breast exam, Calcium and vitamin D intake, exercise. See lab orders: Yes.  Routine labs.  Return annually or prn   An After Visit Summary was printed and given  to the patient.

## 2014-07-04 NOTE — Patient Instructions (Signed)

## 2014-07-05 LAB — COMPREHENSIVE METABOLIC PANEL
ALBUMIN: 4.1 g/dL (ref 3.5–5.2)
ALT: 12 U/L (ref 0–35)
AST: 14 U/L (ref 0–37)
Alkaline Phosphatase: 41 U/L (ref 39–117)
BUN: 11 mg/dL (ref 6–23)
CALCIUM: 9.1 mg/dL (ref 8.4–10.5)
CO2: 27 meq/L (ref 19–32)
Chloride: 103 mEq/L (ref 96–112)
Creat: 0.99 mg/dL (ref 0.50–1.10)
GLUCOSE: 91 mg/dL (ref 70–99)
POTASSIUM: 3.8 meq/L (ref 3.5–5.3)
Sodium: 137 mEq/L (ref 135–145)
TOTAL PROTEIN: 6.6 g/dL (ref 6.0–8.3)
Total Bilirubin: 0.3 mg/dL (ref 0.2–1.2)

## 2014-07-05 LAB — LIPID PANEL
CHOLESTEROL: 162 mg/dL (ref 0–200)
HDL: 48 mg/dL (ref >39)
LDL Cholesterol: 95 mg/dL (ref 0–99)
Total CHOL/HDL Ratio: 3.4 Ratio
Triglycerides: 94 mg/dL (ref <150)
VLDL: 19 mg/dL (ref 0–40)

## 2014-07-11 LAB — IPS PAP TEST WITH HPV

## 2014-07-12 ENCOUNTER — Ambulatory Visit
Admission: RE | Admit: 2014-07-12 | Discharge: 2014-07-12 | Disposition: A | Discharge status: Home / Self Care | Payer: 59 | Source: Ambulatory Visit

## 2014-07-12 DIAGNOSIS — Z1231 Encounter for screening mammogram for malignant neoplasm of breast: Secondary | ICD-10-CM

## 2014-08-21 ENCOUNTER — Ambulatory Visit (INDEPENDENT_AMBULATORY_CARE_PROVIDER_SITE_OTHER): Payer: 59 | Admitting: Certified Nurse Midwife

## 2014-08-21 ENCOUNTER — Encounter: Payer: Self-pay | Admitting: Certified Nurse Midwife

## 2014-08-21 VITALS — BP 108/60 | HR 72 | Resp 16 | Ht 66.0 in | Wt 169.0 lb

## 2014-08-21 DIAGNOSIS — A499 Bacterial infection, unspecified: Secondary | ICD-10-CM

## 2014-08-21 DIAGNOSIS — N760 Acute vaginitis: Secondary | ICD-10-CM

## 2014-08-21 DIAGNOSIS — B9689 Other specified bacterial agents as the cause of diseases classified elsewhere: Secondary | ICD-10-CM

## 2014-08-21 MED ORDER — METRONIDAZOLE 500 MG PO TABS: 500.0000 mg | ORAL_TABLET | Freq: Two times a day (BID) | ORAL | Status: DC

## 2014-08-21 NOTE — Progress Notes (Signed)
43 y o african american divorced female g2p2002 here with complaint of vaginal symptoms of increase discharge and odor. Describes discharge as white thin.. Onset of symptoms 4 days ago. Denies new personal products or vaginal dryness. Periods normal, no issues No STD concerns. Urinary symptoms none .   O:Healthy female WDWN Affect: normal, orientation x 3  Exam: Abdomen:soft, non tender Lymph node: no enlargement or tenderness Pelvic exam: External genital: normal, no lesions BUS: negative Vagina: grey watery odorous  discharge noted. Ph:5.5  ,Wet prep taken Cervix: normal, non tender Uterus: normal, non tender Adnexa:normal, non tender, no masses or fullness noted   Wet Prep results: Clue Cells   A:BV   P:Discussed findings of BV and etiology. Discussed Aveeno or baking soda sitz bath for comfort. Questions addressed. Rx: Metrogel see order with instructions  Rv prn

## 2014-08-21 NOTE — Patient Instructions (Signed)
Bacterial Vaginosis Bacterial vaginosis is a vaginal infection that occurs when the normal balance of bacteria in the vagina is disrupted. It results from an overgrowth of certain bacteria. This is the most common vaginal infection in women of childbearing age. Treatment is important to prevent complications, especially in pregnant women, as it can cause a premature delivery. CAUSES  Bacterial vaginosis is caused by an increase in harmful bacteria that are normally present in smaller amounts in the vagina. Several different kinds of bacteria can cause bacterial vaginosis. However, the reason that the condition develops is not fully understood. RISK FACTORS Certain activities or behaviors can put you at an increased risk of developing bacterial vaginosis, including:  Having a new sex partner or multiple sex partners.  Douching.  Using an intrauterine device (IUD) for contraception. Women do not get bacterial vaginosis from toilet seats, bedding, swimming pools, or contact with objects around them. SIGNS AND SYMPTOMS  Some women with bacterial vaginosis have no signs or symptoms. Common symptoms include:  Grey vaginal discharge.  A fishlike odor with discharge, especially after sexual intercourse.  Itching or burning of the vagina and vulva.  Burning or pain with urination. DIAGNOSIS  Your health care provider will take a medical history and examine the vagina for signs of bacterial vaginosis. A sample of vaginal fluid may be taken. Your health care provider will look at this sample under a microscope to check for bacteria and abnormal cells. A vaginal pH test may also be done.  TREATMENT  Bacterial vaginosis may be treated with antibiotic medicines. These may be given in the form of a pill or a vaginal cream. A second round of antibiotics may be prescribed if the condition comes back after treatment.  HOME CARE INSTRUCTIONS   Only take over-the-counter or prescription medicines as  directed by your health care provider.  If antibiotic medicine was prescribed, take it as directed. Make sure you finish it even if you start to feel better.  Do not have sex until treatment is completed.  Tell all sexual partners that you have a vaginal infection. They should see their health care provider and be treated if they have problems, such as a mild rash or itching.  Practice safe sex by using condoms and only having one sex partner. SEEK MEDICAL CARE IF:   Your symptoms are not improving after 3 days of treatment.  You have increased discharge or pain.  You have a fever. MAKE SURE YOU:   Understand these instructions.  Will watch your condition.  Will get help right away if you are not doing well or get worse. FOR MORE INFORMATION  Centers for Disease Control and Prevention, Division of STD Prevention: www.cdc.gov/std American Sexual Health Association (ASHA): www.ashastd.org  Document Released: 10/19/2005 Document Revised: 08/09/2013 Document Reviewed: 05/31/2013 ExitCare Patient Information 2015 [employer]. This information is not intended to replace advice given to you by your health care provider. Make sure you discuss any questions you have with your health care provider.  

## 2014-08-24 NOTE — Progress Notes (Signed)
Reviewed personally.  M. Suzanne Lorrayne Ismael, MD.  

## 2014-09-03 ENCOUNTER — Encounter: Payer: Self-pay | Admitting: Certified Nurse Midwife

## 2014-09-04 ENCOUNTER — Encounter: Payer: Self-pay | Admitting: Certified Nurse Midwife

## 2014-09-04 ENCOUNTER — Ambulatory Visit (INDEPENDENT_AMBULATORY_CARE_PROVIDER_SITE_OTHER): Payer: 59 | Admitting: Certified Nurse Midwife

## 2014-09-04 ENCOUNTER — Other Ambulatory Visit: Payer: Self-pay | Admitting: Certified Nurse Midwife

## 2014-09-04 VITALS — BP 106/70 | HR 70 | Resp 16 | Ht 66.0 in | Wt 166.0 lb

## 2014-09-04 DIAGNOSIS — B3731 Acute candidiasis of vulva and vagina: Secondary | ICD-10-CM

## 2014-09-04 DIAGNOSIS — B373 Candidiasis of vulva and vagina: Secondary | ICD-10-CM

## 2014-09-04 DIAGNOSIS — N760 Acute vaginitis: Secondary | ICD-10-CM

## 2014-09-04 MED ORDER — FLUCONAZOLE 150 MG PO TABS: 150.0000 mg | ORAL_TABLET | Freq: Once | ORAL | Status: DC

## 2014-09-04 NOTE — Progress Notes (Signed)
43 y.o. divorced african american female g2p2002 here with complaint of vaginal symptoms of itching, burning, and increase discharge. Describes discharge as white and labia skin feels "raw" Used Monistat 1 last pm with some relief.. Onset of symptoms 3 days ago. Denies new personal products or vaginal dryness.Patient has history of HSV 1, but has never had any vaginal symptoms. no STD concerns. Urinary symptoms none .   O:Healthy female WDWN Affect: normal, orientation x 3  Exam: Abdomen: soft, non tender Lymph node: no enlargement or tenderness Pelvic exam: External genital: ? Blister or sloughing areas of skin bilateral on labia with white exudate. Tender. Wet prep taken Cultures taken BUS: negative Vagina: thick white discharge noted. Ph:4.0   ,Wet prep taken Cervix: normal, non tender, IUD string noted Uterus: normal, non tender Adnexa:normal, non tender, no masses or fullness noted   Wet Prep results:postive yeast   A:Yeast vaginitis/vulvitis R/O MRSA, HSV   P:Discussed findings of Yeast vaginitis/vulvitis and etiology. Discussed Aveeno or baking soda sitz bath for comfort. Avoid moist clothes for extended period of time. No sexual activity. Discussed ? HSV, patient very upset and does not believe this is the problem. Will await culture results or if no improvement with Diflucan will Rx Valtrex. Patient will agree to this plan only. Discussed transmission and she has history of HSV 1. Questions addressed.  Rv prn

## 2014-09-05 ENCOUNTER — Encounter: Payer: Self-pay | Admitting: Certified Nurse Midwife

## 2014-09-05 ENCOUNTER — Ambulatory Visit (INDEPENDENT_AMBULATORY_CARE_PROVIDER_SITE_OTHER): Payer: 59 | Admitting: Certified Nurse Midwife

## 2014-09-05 ENCOUNTER — Telehealth: Payer: Self-pay | Admitting: Emergency Medicine

## 2014-09-05 ENCOUNTER — Telehealth: Payer: Self-pay | Admitting: Certified Nurse Midwife

## 2014-09-05 VITALS — BP 118/64 | HR 68 | Resp 16 | Ht 66.0 in | Wt 167.0 lb

## 2014-09-05 DIAGNOSIS — B373 Candidiasis of vulva and vagina: Secondary | ICD-10-CM

## 2014-09-05 DIAGNOSIS — B3731 Acute candidiasis of vulva and vagina: Secondary | ICD-10-CM

## 2014-09-05 NOTE — Telephone Encounter (Signed)
Patient calling really wanting to be seen today instead of tomorrow. She said, "I have some really strange things going on." Patient was seen yesterday by D. Darcel Bayley, CNM. Please advise?

## 2014-09-05 NOTE — Progress Notes (Signed)
Reviewed personally.  M. Suzanne Sahar Ryback, MD.  

## 2014-09-05 NOTE — Telephone Encounter (Signed)
Spoke with patient. She now feels that she is having increased discharge, change from this morning when last spoke with this RN. Patient to come now to office for work in appointment with Verner Choleborah S. Leonard CNM per Debbi.  Routing to provider for final review. Patient agreeable to disposition. Will close encounter

## 2014-09-05 NOTE — Progress Notes (Signed)
43 y.o. divorced african american female g2p2002 here with complaint of vaginal opening seems smaller in the past 2 days. Concerned about appearance, due partner feels it has changed. Patient was see 09/04/14 for yeast vaginitis and ? HSV. Patient feels less irritated and is using medication and aveeno sitz bath as directed. Patient emotionally concerned if HSV positive, due to previous marriage ending because of STD issues. Partner declines any exposure or symptoms of STD's or yeast. Patient does not want to have serology done or Valtrex Rx at this time. "Just want to know everything looks normal"  O:Healthy female WDWN crying at onset of visit Affect: normal, orientation x 3  Exam: Abdomen:soft, non  tender Lymph node: no enlargement or tenderness Pelvic exam: External genital: lower vulva area still red and swollen with exudate present as before, less tender  BUS: negative Vagina: white discharge noted. No wet prep taken Cervix: normal, non tender Uterus: normal, non tender Adnexa:normal, non tender, no masses or fullness noted   Wet Prep results: not done   A:Yeast vaginitis/vulvitis under treatment with Diflucan Labs pending for HSV/MRSA culture Normal female genitalia History of HSV 1 oral    P:Discussed findings of normal female genitalia and edema still present , but less tender. Questions answered at length regarding HSV,MRSA and genital changes with age. Continue current treatment plan as discussed. Patient will be called with results when available   35 minutes spent with patient  in face to face counseling regarding normal female genitalia and HSV concern.

## 2014-09-05 NOTE — Telephone Encounter (Signed)
Spoke with patient at time of incoming call. Patient states that her symptoms of vaginal irritation are much better today.  Patient really feels again that her vaginal opening and clitoris continue to be smaller.  Patient was evaluated by Dr. Edward Jolly 11/2013, and advised if her symptoms continued she could be referred.  Patient declines this today. Requests first available appointment with Dr. Edward Jolly. Scheduled for 09/06/14 at 130.  Routing to provider for final review. Patient agreeable to disposition. Will close encounter

## 2014-09-05 NOTE — Progress Notes (Signed)
Reviewed personally.  M. Suzanne Sherryll Skoczylas, MD.  

## 2014-09-05 NOTE — Telephone Encounter (Signed)
Pt is calling to speak with the nurse she states she was here yesterday and her situation is changing she needs to talk with the nurse ASAP

## 2014-09-06 ENCOUNTER — Encounter: Payer: Self-pay | Admitting: Obstetrics and Gynecology

## 2014-09-06 ENCOUNTER — Other Ambulatory Visit: Payer: Self-pay | Admitting: Dermatology

## 2014-09-06 ENCOUNTER — Ambulatory Visit: Payer: 59 | Admitting: Obstetrics and Gynecology

## 2014-09-06 LAB — HERPES SIMPLEX VIRUS CULTURE: ORGANISM ID, BACTERIA: NOT DETECTED

## 2014-09-07 LAB — MRSA CULTURE

## 2014-09-07 NOTE — Telephone Encounter (Signed)
Left message to call Davieon Stockham at [phone].  Offer patient appointment with Dr.Silva on Monday 11/9 at 1pm with Dr.Silva (time per Kennon RoundsSally).

## 2014-09-07 NOTE — Telephone Encounter (Signed)
Spoke with patient. Appointment scheduled for Monday 11/9 at 1pm with Dr.Silva. Patient is agreeable and verbalizes understanding.  Routing to provider for final review. Patient agreeable to disposition. Will close encounter

## 2014-09-07 NOTE — Telephone Encounter (Signed)
Spoke with patient. Patient states that she is still having vaginal irritation and swelling with discharge. "It has not gotten better since I came in this week. I sent Dr.Silva a mychart message and she told me to call to schedule an appointment." Offered patient an appointment for next Wednesday. "I guess that will have to work. If my vagina isn't gone by then. If she sent me a message telling me to come in to see her I don't think it should be that long." Advised patient will need to check with provider regarding scheduling and return call with best appointment date and time options. Patient is agreeable.

## 2014-09-10 ENCOUNTER — Ambulatory Visit (INDEPENDENT_AMBULATORY_CARE_PROVIDER_SITE_OTHER): Payer: 59 | Admitting: Obstetrics and Gynecology

## 2014-09-10 ENCOUNTER — Encounter: Payer: Self-pay | Admitting: Obstetrics and Gynecology

## 2014-09-10 VITALS — BP 126/80 | HR 66 | Ht 66.0 in | Wt 167.2 lb

## 2014-09-10 DIAGNOSIS — R194 Change in bowel habit: Secondary | ICD-10-CM

## 2014-09-10 DIAGNOSIS — N760 Acute vaginitis: Secondary | ICD-10-CM

## 2014-09-10 DIAGNOSIS — N750 Cyst of Bartholin's gland: Secondary | ICD-10-CM

## 2014-09-10 DIAGNOSIS — R195 Other fecal abnormalities: Secondary | ICD-10-CM

## 2014-09-10 NOTE — Patient Instructions (Addendum)
Docusate capsules What is this medicine? DOCUSATE (doc CUE sayt) is stool softener. It helps prevent constipation and straining or discomfort associated with hard or dry stools. This medicine may be used for other purposes; ask your health care provider or pharmacist if you have questions. COMMON BRAND NAME(S): Colace, Colace Clear, Correctol, D.O.S., DC, Doc-Q-Lace, DocuLace, Docusoft S, DOK, Dulcolax, Genasoft, Kao-Tin, Kaopectate Liqui-Gels, Phillips Stool Softener, Stool Softener, Stool Softner DC, Sulfolax, Sur-Q-Lax, Surfak, Uni-Ease What should I tell my health care provider before I take this medicine? They need to know if you have any of these conditions: -nausea or vomiting -severe constipation -stomach pain -sudden change in bowel habit lasting more than 2 weeks -an unusual or allergic reaction to docusate, other medicines, foods, dyes, or preservatives -pregnant or trying to get pregnant -breast-feeding How should I use this medicine? Take this medicine by mouth with a glass of water. Follow the directions on the label. Take your doses at regular intervals. Do not take your medicine more often than directed. Talk to your pediatrician regarding the use of this medicine in children. While this medicine may be prescribed for children as young as 2 years for selected conditions, precautions do apply. Overdosage: If you think you have taken too much of this medicine contact a poison control center or emergency room at once. NOTE: This medicine is only for you. Do not share this medicine with others. What if I miss a dose? If you miss a dose, take it as soon as you can. If it is almost time for your next dose, take only that dose. Do not take double or extra doses. What may interact with this medicine? -mineral oil This list may not describe all possible interactions. Give your health care provider a list of all the medicines, herbs, non-prescription drugs, or dietary supplements you  use. Also tell them if you smoke, drink alcohol, or use illegal drugs. Some items may interact with your medicine. What should I watch for while using this medicine? Do not use for more than one week without advice from your doctor or health care professional. If your constipation returns, check with your doctor or health care professional. Drink plenty of water while taking this medicine. Drinking water helps decrease constipation. Stop using this medicine and contact your doctor or health care professional if you experience any rectal bleeding or do not have a bowel movement after use. These could be signs of a more serious condition. What side effects may I notice from receiving this medicine? Side effects that you should report to your doctor or health care professional as soon as possible: -allergic reactions like skin rash, itching or hives, swelling of the face, lips, or tongue Side effects that usually do not require medical attention (report to your doctor or health care professional if they continue or are bothersome): -diarrhea -stomach cramps -throat irritation This list may not describe all possible side effects. Call your doctor for medical advice about side effects. You may report side effects to FDA at 1-800-FDA-1088. Where should I keep my medicine? Keep out of the reach of children. Store at room temperature between 15 and 30 degrees C (59 and 86 degrees F). Throw away any unused medicine after the expiration date. NOTE: This sheet is a summary. It may not cover all possible information. If you have questions about this medicine, talk to your doctor, pharmacist, or health care provider.  2015, Elsevier/Gold Standard. (2008-02-09 15:56:49)  Bartholin's Cyst or Abscess Bartholin's glands are small  glands located within the folds of skin (labia) along the sides of the lower opening of the vagina (birth canal). A cyst may develop when the duct of the gland becomes blocked. When  this happens, fluid that accumulates within the cyst can become infected. This is known as an abscess. The Bartholin gland produces a mucous fluid to lubricate the outside of the vagina during sexual intercourse. SYMPTOMS   Patients with a small cyst may not have any symptoms.  Mild discomfort to severe pain depending on the size of the cyst and if it is infected (abscess).  Pain, redness, and swelling around the lower opening of the vagina.  Painful intercourse.  Pressure in the perineal area.  Swelling of the lips of the vagina (labia).  The cyst or abscess can be on one side or both sides of the vagina. DIAGNOSIS   A large swelling is seen in the lower vagina area by your caregiver.  Painful to touch.  Redness and pain, if it is an abscess. TREATMENT   Sometimes the cyst will go away on its own.  Apply warm wet compresses to the area or take hot sitz baths several times a day.  An incision to drain the cyst or abscess with local anesthesia.  Culture the pus, if it is an abscess.  Antibiotic treatment, if it is an abscess.  Cut open the gland and suture the edges to make the opening of the gland bigger (marsupialization).  Remove the whole gland if the cyst or abscess returns. PREVENTION   Practice good hygiene.  Clean the vaginal area with a mild soap and soft cloth when bathing.  Do not rub hard in the vaginal area when bathing.  Protect the crotch area with a padded cushion if you take long bike rides or ride horses.  Be sure you are well lubricated when you have sexual intercourse. HOME CARE INSTRUCTIONS   If your cyst or abscess was opened, a small piece of gauze, or a drain, may have been placed in the wound to allow drainage. Do not remove this gauze or drain unless directed by your caregiver.  Wear feminine pads, not tampons, as needed for any drainage or bleeding.  If antibiotics were prescribed, take them exactly as directed. Finish the entire  course.  Only take over-the-counter or prescription medicines for pain, discomfort, or fever as directed by your caregiver. SEEK IMMEDIATE MEDICAL CARE IF:   You have an increase in pain, redness, swelling, or drainage.  You have bleeding from the wound which results in the use of more than the number of pads suggested by your caregiver in 24 hours.  You have chills.  You have a fever.  You develop any new problems (symptoms) or aggravation of your existing condition. MAKE SURE YOU:   Understand these instructions.  Will watch your condition.  Will get help right away if you are not doing well or get worse. Document Released: 10/19/2005 Document Revised: 01/11/2012 Document Reviewed: 06/06/2008 [facility] Patient Information 2015 [employer], [place]. This information is not intended to replace advice given to you by your health care provider. Make sure you discuss any questions you have with your health care provider.

## 2014-09-10 NOTE — Progress Notes (Signed)
Patient ID: Annette Larsen, female   DOB: 1971-04-02, 43 y.o.   MRN: [id number] GYNECOLOGY VISIT  PCP:   Cain Saupeammie Fulp, MD  Referring provider:   HPI: 43 y.o.   Single  African American  female   G2P2 with Patient's last menstrual period was 08/25/2014 (within days).   here for follow up visit for vaginal and rectal irritation and "vagina shrinking." Patient has sought multiple consultations in the last 2 weeks including 3 pror visits to our office, urgent care, a dermatologist, and the emergency department.  Symptoms occurring for about 9 days. Began as a UTI treatment with Bactrim through Urgent Care.  Developed significant external vulvar irritation, which are now improved.  Was treated for bacterial vaginosis with Flagyl and then yeast vaginitis with Diflucan.   Had negative MRSA and HSV cultures.  Saw dermatologist and had a biopsy of the vulva last week by Dr. Campbell StallHope Gruber, and results are pending.  Saw her also for a scalp check.   Was seen through the Emergency Department as well.   Vaginal opening has shrunk and now anal opening is tight.  Bowel movement was smaller than usual.   No new partners.  No new exposures.  No new detergents.   GYNECOLOGIC HISTORY: Patient's last menstrual period was 08/25/2014 (within days). Sexually active: no  Partner preference: female Contraception: Tubal ligation   Menopausal hormone therapy: n/a DES exposure:  no  Blood transfusions: no   Sexually transmitted diseases:no GYN procedures and prior surgeries:  Tubal ligation, ablation Last mammogram: 07-12-14 wnl:[facility]                Last pap and high risk HPV testing: 07-04-14 wnl:neg HR HPV History of abnormal pap smear: 2008, 2011 LSIL, no treatment to cervix, only repeat paps which reverted to normal.    OB History    Gravida Para Term Preterm AB TAB SAB Ectopic Multiple Living   2 2        2        LIFESTYLE: Exercise:  Running/aerobics/weights             Tobacco:   no Alcohol:   Occ. Glass of wine Drug use:  no  Patient Active Problem List   Diagnosis Date Noted  . Folliculitis 03/24/2013  . S/P endometrial ablation 07/20/2012  . Cervical dysplasia 07/20/2012    Past Medical History  Diagnosis Date  . IBS (irritable bowel syndrome)   . Acne   . Condyloma 09/2007  . LGSIL (low grade squamous intraepithelial dysplasia)     04/2007, 04/2009,   . Benign cellular changes seen on Pap smear 04/2010  . Acne     Past Surgical History  Procedure Laterality Date  . Tubal ligation  1999  . Endometrial ablation  2005  . Flexible sigmoidoscopy  03/15/2008    Current Outpatient Prescriptions  Medication Sig Dispense Refill  . ACANYA gel   1  . Cholecalciferol (VITAMIN D3) 5000 UNITS CAPS Take by mouth.    . doxycycline (MONODOX) 100 MG capsule Take 100 mg by mouth.    . fluconazole (DIFLUCAN) 150 MG tablet Take 1 tablet (150 mg total) by mouth once. Take one tablet.  Repeat in 48 hours if symptoms are not completely resolved. 2 tablet 0  . fluocinonide (LIDEX) 0.05 % external solution   0  . Zinc Sulfate (ZINC 15 PO) Take by mouth.    . [DISCONTINUED] spironolactone (ALDACTONE) 50 MG tablet Take 50 mg by mouth  daily.       No current facility-administered medications for this visit.     ALLERGIES: Review of patient's allergies indicates no known allergies.  Family History  Problem Relation Age of Onset  . Diabetes Brother 65    died with complications from diabetes  . Hypertension Father   . Heart disease Father   . Diabetes Mother   . Migraines Mother     History   Social History  . Marital Status: Single    Spouse Name: N/A    Number of Children: N/A  . Years of Education: N/A   Occupational History  . Not on file.   Social History Main Topics  . Smoking status: Never Smoker   . Smokeless tobacco: Never Used  . Alcohol Use: 0.0 oz/week    0 Not specified per week     Comment: occ glass of wine  . Drug Use: No  . Sexual  Activity:    Partners: Male    Birth Control/ Protection: Surgical     Comment: TUBAL LIGATION   Other Topics Concern  . Not on file   Social History Narrative    ROS:  Pertinent items are noted in HPI.  PHYSICAL EXAMINATION:    BP 126/80 mmHg  Pulse 66  Ht 5\' 6"  (1.676 m)  Wt 167 lb 3.2 oz (75.841 kg)  BMI 27.00 kg/m2  LMP 08/25/2014 (Within Days)   Wt Readings from Last 3 Encounters:  09/10/14 167 lb 3.2 oz (75.841 kg)  09/05/14 167 lb (75.751 kg)  09/04/14 166 lb (75.297 kg)     Ht Readings from Last 3 Encounters:  09/10/14 5\' 6"  (1.676 m)  09/05/14 5\' 6"  (1.676 m)  09/04/14 5\' 6"  (1.676 m)    General appearance: alert, cooperative and appears stated age   Pelvic: External genitalia:  no lesions              Urethra:  normal appearing urethra with no masses, tenderness or lesions              Bartholins and Skenes: normal                 Vagina: normal appearing vagina with normal color and discharge, 1.5 cm right Bartholin's gland cyst without abscess.  White vaginal discharge.               Cervix: normal appearance                 Bimanual Exam:  Uterus:  uterus is normal size, shape, consistency and nontender                                      Adnexa: normal adnexa in size, nontender and no masses                                      Rectovaginal: Confirms                                      Anus:  normal sphincter tone, no lesions  Wet prep - ph 5.0, negative clue cells, yeast, and trichomonas.   ASSESSMENT Vulvovaginitis.  Resolved.  Right Bartholin's gland cyst. New onset.  No abscess. I  believe this is secondary to the vulvovaginal inflammation.  I discussed the rare possibility of an adenocarcinoma of the gland.   PLAN  Return for a recheck of vulvitis and Bartholin's gland in 3 weeks, sooner as needed. I encouraged the patient to have more focused gynecologic care through our office so that she can have a more coordinated approach.   An After  Visit Summary was printed and given to the patient.  25 minutes face to face time of which over 50% was spent in counseling.

## 2014-10-05 ENCOUNTER — Ambulatory Visit: Payer: 59 | Admitting: Obstetrics and Gynecology

## 2014-10-09 ENCOUNTER — Emergency Department (HOSPITAL_COMMUNITY): Payer: 59

## 2014-10-09 ENCOUNTER — Emergency Department (HOSPITAL_COMMUNITY)
Admission: EM | Admit: 2014-10-09 | Discharge: 2014-10-09 | Disposition: A | Discharge status: Home / Self Care | Payer: 59 | Attending: Emergency Medicine | Admitting: Emergency Medicine

## 2014-10-09 ENCOUNTER — Encounter (HOSPITAL_COMMUNITY): Payer: Self-pay | Admitting: *Deleted

## 2014-10-09 DIAGNOSIS — Y9389 Activity, other specified: Secondary | ICD-10-CM | POA: Insufficient documentation

## 2014-10-09 DIAGNOSIS — Y92410 Unspecified street and highway as the place of occurrence of the external cause: Secondary | ICD-10-CM | POA: Diagnosis not present

## 2014-10-09 DIAGNOSIS — Z872 Personal history of diseases of the skin and subcutaneous tissue: Secondary | ICD-10-CM | POA: Diagnosis not present

## 2014-10-09 DIAGNOSIS — Z792 Long term (current) use of antibiotics: Secondary | ICD-10-CM | POA: Insufficient documentation

## 2014-10-09 DIAGNOSIS — Z8719 Personal history of other diseases of the digestive system: Secondary | ICD-10-CM | POA: Insufficient documentation

## 2014-10-09 DIAGNOSIS — Y998 Other external cause status: Secondary | ICD-10-CM | POA: Diagnosis not present

## 2014-10-09 DIAGNOSIS — Z79899 Other long term (current) drug therapy: Secondary | ICD-10-CM | POA: Diagnosis not present

## 2014-10-09 DIAGNOSIS — S199XXA Unspecified injury of neck, initial encounter: Secondary | ICD-10-CM | POA: Diagnosis present

## 2014-10-09 DIAGNOSIS — Z8619 Personal history of other infectious and parasitic diseases: Secondary | ICD-10-CM | POA: Insufficient documentation

## 2014-10-09 DIAGNOSIS — S161XXA Strain of muscle, fascia and tendon at neck level, initial encounter: Secondary | ICD-10-CM | POA: Diagnosis not present

## 2014-10-09 MED ORDER — CYCLOBENZAPRINE HCL 5 MG PO TABS: 5.0000 mg | ORAL_TABLET | Freq: Three times a day (TID) | ORAL | Status: DC | PRN

## 2014-10-09 MED ORDER — IBUPROFEN 600 MG PO TABS: 600.0000 mg | ORAL_TABLET | Freq: Four times a day (QID) | ORAL | Status: DC | PRN

## 2014-10-09 MED ORDER — HYDROCODONE-ACETAMINOPHEN 5-325 MG PO TABS: 1.0000 | ORAL_TABLET | ORAL | Status: DC | PRN

## 2014-10-09 MED ORDER — HYDROCODONE-ACETAMINOPHEN 5-325 MG PO TABS
1.0000 | ORAL_TABLET | Freq: Once | ORAL | Status: AC
  Administered 2014-10-09: 1 via ORAL
  Filled 2014-10-09: qty 1
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
## 2014-10-09 NOTE — ED Notes (Signed)
Patient verbalizes understanding of discharge instructions, prescription medications, home care and follow up care if needed. Patient ambulatory out of department at this time with mother.
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
## 2014-11-16 ENCOUNTER — Other Ambulatory Visit: Payer: Self-pay | Admitting: Family Medicine

## 2014-11-16 DIAGNOSIS — M549 Dorsalgia, unspecified: Secondary | ICD-10-CM

## 2014-11-20 ENCOUNTER — Ambulatory Visit
Admission: RE | Admit: 2014-11-20 | Discharge: 2014-11-20 | Disposition: A | Discharge status: Home / Self Care | Payer: 59 | Source: Ambulatory Visit | Attending: Family Medicine | Admitting: Family Medicine

## 2014-11-20 DIAGNOSIS — M549 Dorsalgia, unspecified: Secondary | ICD-10-CM

## 2014-12-24 ENCOUNTER — Other Ambulatory Visit: Payer: Self-pay | Admitting: Gastroenterology

## 2014-12-24 DIAGNOSIS — R1084 Generalized abdominal pain: Secondary | ICD-10-CM

## 2014-12-24 DIAGNOSIS — R140 Abdominal distension (gaseous): Secondary | ICD-10-CM

## 2015-01-02 ENCOUNTER — Ambulatory Visit
Admission: RE | Admit: 2015-01-02 | Discharge: 2015-01-02 | Disposition: A | Discharge status: Home / Self Care | Payer: 59 | Source: Ambulatory Visit | Attending: Gastroenterology | Admitting: Gastroenterology

## 2015-01-02 DIAGNOSIS — R140 Abdominal distension (gaseous): Secondary | ICD-10-CM

## 2015-01-02 DIAGNOSIS — R1084 Generalized abdominal pain: Secondary | ICD-10-CM

## 2015-04-24 ENCOUNTER — Other Ambulatory Visit: Payer: Self-pay

## 2015-04-24 DIAGNOSIS — Z1231 Encounter for screening mammogram for malignant neoplasm of breast: Secondary | ICD-10-CM

## 2015-05-15 ENCOUNTER — Telehealth: Payer: Self-pay | Admitting: Obstetrics and Gynecology

## 2015-05-15 NOTE — Telephone Encounter (Signed)
Patient has some questions regarding previous labs. Last seen 09/10/14.

## 2015-05-15 NOTE — Telephone Encounter (Signed)
Spoke with patient. Patient would like to know when last lipid panel and glucose level were performed. Advised these were checked last on 07/04/2014. Patient is agreeable. Patient denies any current problems or concerns. States she would like lab work performed at her next aex which is scheduled for 97//2016. Advised will need to speak with Dr.Silva about this at appointment so that necessary labs may be drawn. Patient is agreeable.  Routing to provider for final review. Patient agreeable to disposition. Will close encounter.   Patient aware provider will review message and nurse will return call if any additional advice or change of disposition.

## 2015-07-10 ENCOUNTER — Ambulatory Visit: Payer: Self-pay | Admitting: Obstetrics and Gynecology

## 2015-07-15 ENCOUNTER — Ambulatory Visit: Payer: 59

## 2015-08-08 ENCOUNTER — Ambulatory Visit: Payer: Self-pay | Admitting: Obstetrics and Gynecology

## 2015-08-08 ENCOUNTER — Ambulatory Visit (INDEPENDENT_AMBULATORY_CARE_PROVIDER_SITE_OTHER): Payer: 59 | Admitting: Obstetrics and Gynecology

## 2015-08-08 ENCOUNTER — Encounter: Payer: Self-pay | Admitting: Obstetrics and Gynecology

## 2015-08-08 VITALS — BP 120/76 | HR 60 | Resp 16 | Ht 66.0 in | Wt 176.0 lb

## 2015-08-08 DIAGNOSIS — Z01419 Encounter for gynecological examination (general) (routine) without abnormal findings: Secondary | ICD-10-CM | POA: Diagnosis not present

## 2015-08-08 DIAGNOSIS — Z0000 Encounter for general adult medical examination without abnormal findings: Secondary | ICD-10-CM

## 2015-08-08 LAB — POCT URINALYSIS DIPSTICK
BILIRUBIN UA: NEGATIVE
Blood, UA: NEGATIVE
Glucose, UA: NEGATIVE
KETONES UA: NEGATIVE
Leukocytes, UA: NEGATIVE
Nitrite, UA: NEGATIVE
PH UA: 5
Protein, UA: NEGATIVE
Urobilinogen, UA: NEGATIVE

## 2015-08-08 NOTE — Progress Notes (Signed)
Patient ID: Annette Larsen, female   DOB: 05-Jul-1971, 44 y.o.   MRN: [id number] 44 y.o. G2P2 Single African American female here for annual exam.    Feeling chronic fatigue and hair shedding.  Going on for years.  Has seen dermatology.  Told it could be early alopecia.  Still has a lot of hair so does not want any tx for this.  Denies stress.   Patient complaining of several episodes of vaginal irritation.  She was seen here several times and treated for yeast but patient concerned it wasn't yeast. These symptoms did resolve after one week. 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   Still reporting that the clitoris has shrunk and the opening to the vagina is smaller.  Feels it looks different.  No pain or dryness with intercourse.  Has had normal testosterone, estradiol, and TSH testing.   Menses - monthly with spotting only. Some low back pain.   Manageable.  Hx of ablation.  No hot flashes.   Leg cramps and constipation.   Asking for a pap today.  Periodically sexually active.  Same partner.  STD screening - declined today.   PCP: Cain Saupe, MD.   Will make an appointment to see PCP.   Patient's last menstrual period was 07/19/2015 (exact date).          Sexually active: Yes.   female partner The current method of family planning is tubal ligation.   Condoms some of the time.  Exercising: Yes.   aerobic 2-3 days/week. Smoker:  no  Health Maintenance: Pap: 07-04-14  Neg:Neg HR HPV History of abnormal Pap:  Yes, 2008, 2011 LSIL:no treatment to cervix, only repeat paps which reverted to normal.   MMG: 07-12-14 Density Cat.B/Neg:BiRads 1: [facility].--appt. Scheduled 08-26-15. Colonoscopy:  n/a BMD:   n/a  Result  n/a TDaP:  2011 Screening Labs:  Hb today: PCP, Urine today: Neg   reports that she has never smoked. She has never used smokeless tobacco. She reports that she does not drink alcohol or use illicit  drugs.  Past Medical History  Diagnosis Date  . IBS (irritable bowel syndrome)   . Acne   . Condyloma 09/2007  . LGSIL (low grade squamous intraepithelial dysplasia)     04/2007, 04/2009,   . Benign cellular changes seen on Pap smear 04/2010  . Acne   . Cyst of right Bartholin's gland 2015    Past Surgical History  Procedure Laterality Date  . Tubal ligation  1999  . Endometrial ablation  2005  . Flexible sigmoidoscopy  03/15/2008    Current Outpatient Prescriptions  Medication Sig Dispense Refill  . ACANYA gel   1  . Cholecalciferol (VITAMIN D3) 5000 UNITS CAPS Take by mouth.    . cyclobenzaprine (FLEXERIL) 5 MG tablet Take 1 tablet (5 mg total) by mouth 3 (three) times daily as needed for muscle spasms. 30 tablet 0  . doxycycline (VIBRAMYCIN) 100 MG capsule Take 1 capsule by mouth daily.  0  . fluconazole (DIFLUCAN) 150 MG tablet Take 1 tablet (150 mg total) by mouth once. Take one tablet.  Repeat in 48 hours if symptoms are not completely resolved. 2 tablet 0  . fluocinonide (LIDEX) 0.05 % external solution   0  . Zinc Sulfate (ZINC 15 PO) Take by mouth.    . [DISCONTINUED] spironolactone (ALDACTONE) 50 MG tablet Take 50 mg by mouth daily.       No current facility-administered  medications for this visit.    Family History  Problem Relation Age of Onset  . Diabetes Brother 64    died with complications from diabetes  . Hypertension Father   . Heart disease Father   . Diabetes Mother   . Migraines Mother     ROS:  Pertinent items are noted in HPI.  Otherwise, a comprehensive ROS was negative.  Exam:   BP 120/76 mmHg  Pulse 60  Resp 16  Ht  (1.676 m)  Wt 176 lb (79.833 kg)  BMI 28.42 kg/m2  LMP 07/19/2015 (Exact Date)    General appearance: alert, cooperative and appears stated age Head: Normocephalic, without obvious abnormality, atraumatic Neck: no adenopathy, supple, symmetrical, trachea midline and thyroid normal to inspection and palpation Lungs:  clear to auscultation bilaterally Breasts: normal appearance, no masses or tenderness, Inspection negative, No nipple retraction or dimpling, No nipple discharge or bleeding, No axillary or supraclavicular adenopathy Heart: regular rate and rhythm Abdomen: soft, non-tender; bowel sounds normal; no masses,  no organomegaly Extremities: extremities normal, atraumatic, no cyanosis or edema Skin: Skin color, texture, turgor normal. No rashes or lesions Lymph nodes: Cervical, supraclavicular, and axillary nodes normal. No abnormal inguinal nodes palpated Neurologic: Grossly normal  Pelvic: External genitalia:  no lesions              Urethra:  normal appearing urethra with no masses, tenderness or lesions              Bartholins and Skenes: normal                 Vagina: normal appearing vagina with normal color and discharge, no lesions              Cervix: no lesions              Pap taken: Yes.   Bimanual Exam:  Uterus:  normal size, contour, position, consistency, mobility, non-tender              Adnexa: normal adnexa and no mass, fullness, tenderness              Rectovaginal: Yes.  .  Confirms.              Anus:  normal sphincter tone, no lesions  Chaperone was present for exam.  Assessment:   Well woman visit with normal exam.  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  Status post BTL.  Status post endometrial ablation.  History of HSV 1. Chronic fatigue.   Leg cramps.  Constipation.   Plan: Yearly mammogram recommended after age 59.  Recommended self breast exam.  Pap and HR HPV as above. Discussed Calcium, Vitamin D, regular exercise program including cardiovascular and weight bearing exercise. Labs performed.  No..    Declines STD testing.  Refills given on medications.  No..   I have reassured patient that she has normal pelvic anatomy and no evidence of lichen sclerosus.  Will see her PCP and do evaluation for fatigue.  I did discuss that there is  a diagnosis of chronic fatigue but that this is a diagnosis of exclusion.  Discussed adding magnesium to her daily medication.  Can take as calcium, vit D with magnesium. Follow up annually and prn.      After visit summary provided.

## 2015-08-08 NOTE — Patient Instructions (Signed)

## 2015-08-13 LAB — IPS PAP TEST WITH HPV

## 2015-08-16 ENCOUNTER — Ambulatory Visit: Payer: 59

## 2015-08-23 ENCOUNTER — Telehealth: Payer: Self-pay | Admitting: Obstetrics and Gynecology

## 2015-08-23 MED ORDER — METRONIDAZOLE 0.75 % VA GEL: 1.0000 | Freq: Two times a day (BID) | VAGINAL | Status: DC

## 2015-08-23 NOTE — Telephone Encounter (Signed)
Spoke with patient. Advised of message as seen below from Dr.Silva. Patient is agreeable. Rx for Metrogel pv for 5 nights 0RF sent to pharmacy on file.  Routing to provider for final review. Patient agreeable to disposition. Will close encounter.

## 2015-08-23 NOTE — Telephone Encounter (Signed)
I will agree to treating with Metrogel pv at hs for 5 nights.  Office visit if symptoms persist.

## 2015-08-23 NOTE — Telephone Encounter (Signed)
Spoke with patient. Patient states that she was just seen for her aex on 10/6. Over the last few days has developed a thin watery discharge with a fishy odor. "I have had yeast and BV a lot and I know that this is BV. I am not concerned about having any STD's." Patient is requesting medication for BV "since I have had this a lot and I was just in the office for my annual." Advised I will speak with Dr.Silva regarding symptoms and recommendations and return call. Patient is agreeable.

## 2015-08-23 NOTE — Telephone Encounter (Signed)
Patient called to get a refill on a prescription for vaginitis sent to [facility] in  at [phone].

## 2015-08-26 ENCOUNTER — Ambulatory Visit
Admission: RE | Admit: 2015-08-26 | Discharge: 2015-08-26 | Disposition: A | Discharge status: Home / Self Care | Payer: 59 | Source: Ambulatory Visit

## 2015-08-26 DIAGNOSIS — Z1231 Encounter for screening mammogram for malignant neoplasm of breast: Secondary | ICD-10-CM

## 2015-09-20 ENCOUNTER — Ambulatory Visit: Payer: Self-pay | Admitting: Obstetrics and Gynecology

## 2015-09-25 DIAGNOSIS — B3731 Acute candidiasis of vulva and vagina: Secondary | ICD-10-CM | POA: Insufficient documentation

## 2016-08-17 NOTE — Progress Notes (Signed)
45 y.o. G2P2 Single African American female here for annual exam.    Really wants a pap and HR HPV testing today.  Wants GC/CT today.  Declines blood testing for STDs.  Regular menses.  Light menses.  Cramps are manageable with NSAIDs.  Has had an endometrial ablation.   [facility]  Works for [employer] [place] in [place].  Did labs through PCP.   Having GI issues.  Having loose BMs. Some constipation.  Stopped probiotics.  Sees Dr. Bosie ClosSchooler.  PCP:   Dr. Jillyn HiddenFulp  Patient's last menstrual period was 07/22/2016 (approximate).           Sexually active: Yes.    The current method of family planning is tubal ligation.    Exercising: Yes.    walking, running Smoker:  no  Health Maintenance: Pap:  08-08-15 Neg:Neg HR HPV History of abnormal Pap:  Yes, 2008, 2011 LSIL:no treatment to cervix, only repeat paps which reverted to normal.  MMG:  08-28-15 3D/Density B/Neg/BiRads1:[facility] Colonoscopy:  never BMD:   never   TDaP:  2011  Gardasil:   no HIV: 04/26/12 non reactive Hep C:  Declines today. Screening Labs: PCP  Hb today: PCP, Urine today: unable to void at this time   reports that she has never smoked. She has never used smokeless tobacco. She reports that she does not drink alcohol or use drugs.  Past Medical History:  Diagnosis Date  . Acne   . Acne   . Benign cellular changes seen on Pap smear 04/2010  . Condyloma 09/2007  . Cyst of right Bartholin's gland 2015  . IBS (irritable bowel syndrome)   . LGSIL (low grade squamous intraepithelial dysplasia)    04/2007, 04/2009,     Past Surgical History:  Procedure Laterality Date  . ENDOMETRIAL ABLATION  2005  . FLEXIBLE SIGMOIDOSCOPY  03/15/2008  . TUBAL LIGATION  1999    Current Outpatient Prescriptions  Medication Sig Dispense Refill  . ACANYA gel   1  . Cholecalciferol (VITAMIN D3) 5000 UNITS CAPS Take by mouth.    . doxycycline (VIBRAMYCIN) 100 MG capsule Take 1  capsule by mouth daily.  0  . fluocinonide (LIDEX) 0.05 % external solution   0   No current facility-administered medications for this visit.     Family History  Problem Relation Age of Onset  . Diabetes Brother 743    died with complications from diabetes  . Hypertension Father   . Heart disease Father   . Diabetes Mother   . Migraines Mother     ROS:  Pertinent items are noted in HPI.  Otherwise, a comprehensive ROS was negative.  Exam:   BP 118/76 (BP Location: Left Arm, Patient Position: Sitting, Cuff Size: Normal)   Pulse 72   Resp 14   Ht 5\' 6"  (1.676 m)   Wt 175 lb (79.4 kg)   LMP 07/22/2016 (Approximate)   BMI 28.25 kg/m     General appearance: alert, cooperative and appears stated age Head: Normocephalic, without obvious abnormality, atraumatic Neck: no adenopathy, supple, symmetrical, trachea midline and thyroid normal to inspection and palpation Lungs: clear to auscultation bilaterally Breasts: normal appearance, no masses or tenderness, No nipple retraction or dimpling, No nipple discharge or bleeding, No axillary or supraclavicular adenopathy Heart: regular rate and rhythm Abdomen: soft, non-tender; no masses, no organomegaly Extremities: extremities normal, atraumatic, no cyanosis or edema Skin: Skin color, texture, turgor normal. No rashes  or lesions Lymph nodes: Cervical, supraclavicular, and axillary nodes normal. No abnormal inguinal nodes palpated Neurologic: Grossly normal  Pelvic: External genitalia:  no lesions              Urethra:  normal appearing urethra with no masses, tenderness or lesions              Bartholins and Skenes: normal                 Vagina: normal appearing vagina with normal color and discharge, no lesions              Cervix: no lesions              Pap taken: Yes.   Bimanual Exam:  Uterus:  normal size, contour, position, consistency, mobility, non-tender              Adnexa: no mass, fullness, tenderness               Rectal exam: Yes.  .  Confirms.              Anus:  normal sphincter tone, no lesions  Chaperone was present for exam.  Assessment:   Well woman visit with normal exam. 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  Status post BTL.  Status post endometrial ablation.  History of HSV 1. Chronic fatigue.   Leg cramps.  Constipation and diarrhea.  Plan: Yearly mammogram recommended after age 87.  Recommended self breast exam.  Pap and HR HPV, GC/CT to [facility] Discussed Calcium, Vitamin D, regular exercise program including cardiovascular and weight bearing exercise. Labs with PCP. Restart probiotics.  Return to GI if symptoms persist.  Follow up annually and prn.       After visit summary provided.

## 2016-08-19 ENCOUNTER — Encounter: Payer: Self-pay | Admitting: Obstetrics and Gynecology

## 2016-08-19 ENCOUNTER — Ambulatory Visit (INDEPENDENT_AMBULATORY_CARE_PROVIDER_SITE_OTHER): Payer: 59 | Admitting: Obstetrics and Gynecology

## 2016-08-19 VITALS — BP 118/76 | HR 72 | Resp 14 | Ht 66.0 in | Wt 175.0 lb

## 2016-08-19 DIAGNOSIS — Z01419 Encounter for gynecological examination (general) (routine) without abnormal findings: Secondary | ICD-10-CM | POA: Diagnosis not present

## 2016-08-19 DIAGNOSIS — Z113 Encounter for screening for infections with a predominantly sexual mode of transmission: Secondary | ICD-10-CM

## 2016-08-19 NOTE — Patient Instructions (Signed)

## 2016-08-26 LAB — PAP IG, CT-NG NAA, HPV HIGH-RISK
Chlamydia, Nuc. Acid Amp: NEGATIVE
Gonococcus by Nucleic Acid Amp: NEGATIVE
HPV, HIGH-RISK: NEGATIVE
PAP Smear Comment: 0

## 2016-09-03 ENCOUNTER — Telehealth: Payer: Self-pay

## 2016-09-03 NOTE — Telephone Encounter (Signed)
Spoke with patient and notified her pap neg and neg HR HPV and neg GC/neg CT. AEX 08-27-17.

## 2016-12-03 HISTORY — PX: OTHER SURGICAL HISTORY: SHX169

## 2016-12-13 ENCOUNTER — Encounter (HOSPITAL_COMMUNITY): Payer: Self-pay | Admitting: Emergency Medicine

## 2016-12-13 ENCOUNTER — Emergency Department (HOSPITAL_COMMUNITY)
Admission: EM | Admit: 2016-12-13 | Discharge: 2016-12-14 | Disposition: A | Discharge status: Home / Self Care | Payer: 59 | Attending: Emergency Medicine | Admitting: Emergency Medicine

## 2016-12-13 DIAGNOSIS — K625 Hemorrhage of anus and rectum: Secondary | ICD-10-CM | POA: Diagnosis not present

## 2016-12-13 DIAGNOSIS — Z79899 Other long term (current) drug therapy: Secondary | ICD-10-CM | POA: Insufficient documentation

## 2016-12-13 DIAGNOSIS — K649 Unspecified hemorrhoids: Secondary | ICD-10-CM

## 2016-12-13 LAB — CBC WITH DIFFERENTIAL/PLATELET
BASOS ABS: 0.1 10*3/uL (ref 0.0–0.1)
BASOS PCT: 1 %
EOS ABS: 0.1 10*3/uL (ref 0.0–0.7)
EOS PCT: 1 %
HCT: 43.9 % (ref 36.0–46.0)
Hemoglobin: 15.3 g/dL — ABNORMAL HIGH (ref 12.0–15.0)
Lymphocytes Relative: 34 %
Lymphs Abs: 3.5 10*3/uL (ref 0.7–4.0)
MCH: 30.7 pg (ref 26.0–34.0)
MCHC: 34.9 g/dL (ref 30.0–36.0)
MCV: 88.2 fL (ref 78.0–100.0)
Monocytes Absolute: 0.8 10*3/uL (ref 0.1–1.0)
Monocytes Relative: 8 %
Neutro Abs: 5.7 10*3/uL (ref 1.7–7.7)
Neutrophils Relative %: 56 %
PLATELETS: 277 10*3/uL (ref 150–400)
RBC: 4.98 MIL/uL (ref 3.87–5.11)
RDW: 13.2 % (ref 11.5–15.5)
WBC: 10.1 10*3/uL (ref 4.0–10.5)

## 2016-12-13 LAB — BASIC METABOLIC PANEL
ANION GAP: 9 (ref 5–15)
BUN: 8 mg/dL (ref 6–20)
CO2: 25 mmol/L (ref 22–32)
Calcium: 9.4 mg/dL (ref 8.9–10.3)
Chloride: 104 mmol/L (ref 101–111)
Creatinine, Ser: 0.97 mg/dL (ref 0.44–1.00)
Glucose, Bld: 132 mg/dL — ABNORMAL HIGH (ref 65–99)
POTASSIUM: 3.5 mmol/L (ref 3.5–5.1)
SODIUM: 138 mmol/L (ref 135–145)

## 2016-12-13 NOTE — ED Provider Notes (Signed)
By signing my name below, I, Clarisse Gouge, attest that this documentation has been prepared under the direction and in the presence of Kristen N Ward, DO. Electronically signed, Clarisse Gouge, ED Scribe. 12/13/16. 11:41 PM.   TIME SEEN: 2333  CHIEF COMPLAINT: Rectal bleeding  HPI:  Annette Larsen is a 46 y.o. female with a history of IBS who presents to the Emergency Department complaining of bright red rectal bleeding ~2200 this evening. She adds loose bowels recently.  States tonight that she felt very lightheaded like she may pass out. She states that she noticed something warm in her underwear and went to the bathroom and found a large amount of bright red blood that soaked her underwear and pants. Was not passing any clots. Had a bowel movement and felt like the blood was mixed into her stool. States she's been told that she has hemorrhoids. She denies any recent constipation, abdominal pain, chest pain or shortness of breath. Pt states her Gastroenterologist is Dr. Charlott Rakes, and she reports what sounds like flex sigmoidoscopy around 2008 with normal results. No Hx of a colonoscopy or endoscopy noted. Pt denies melena.  Patient is not on antiplatelets or anticoagulants. No history of Crohn's disease, ulcerative colitis, colon cancer. No family history of colon cancer. States she has noticed small amount of bright red bleeding with bowel movements for the past several weeks.  ROS: See HPI Constitutional: no fever  Eyes: no drainage  ENT: no runny nose   Cardiovascular:  no chest pain  Resp: no SOB  GI: no vomiting GU: no dysuria Integumentary: no rash  Allergy: no hives  Musculoskeletal: no leg swelling  Neurological: no slurred speech ROS otherwise negative  PAST MEDICAL HISTORY/PAST SURGICAL HISTORY:  Past Medical History:  Diagnosis Date  . Acne   . Acne   . Benign cellular changes seen on Pap smear 04/2010  . Condyloma 09/2007  . Cyst of right Bartholin's gland  2015  . IBS (irritable bowel syndrome)   . LGSIL (low grade squamous intraepithelial dysplasia)    04/2007, 04/2009,     MEDICATIONS:  Prior to Admission medications   Medication Sig Start Date End Date Taking? Authorizing Provider  ACANYA gel  09/06/14   Historical Provider, MD  Cholecalciferol (VITAMIN D3) 5000 UNITS CAPS Take by mouth.    Historical Provider, MD  doxycycline (VIBRAMYCIN) 100 MG capsule Take 1 capsule by mouth daily. 07/20/15   Historical Provider, MD  fluocinonide (LIDEX) 0.05 % external solution  09/06/14   Historical Provider, MD    ALLERGIES:  No Known Allergies  SOCIAL HISTORY:  Social History  Substance Use Topics  . Smoking status: Never Smoker  . Smokeless tobacco: Never Used  . Alcohol use No    FAMILY HISTORY: Family History  Problem Relation Age of Onset  . Diabetes Brother 7    died with complications from diabetes  . Hypertension Father   . Heart disease Father   . Diabetes Mother   . Migraines Mother     EXAM: BP 156/95 (BP Location: Left Arm)   Pulse 90   Temp 97.8 F (36.6 C) (Oral)   Resp 20   Ht 5\' 6"  (1.676 m)   Wt 175 lb (79.4 kg)   SpO2 100%   BMI 28.25 kg/m  CONSTITUTIONAL: Alert and oriented and responds appropriately to questions. Well-appearing; well-nourished HEAD: Normocephalic EYES: Conjunctivae clear, PERRL, EOMI ENT: normal nose; no rhinorrhea; moist mucous membranes NECK: Supple, no meningismus, no nuchal rigidity,  no LAD  CARD: RRR; S1 and S2 appreciated; no murmurs, no clicks, no rubs, no gallops RESP: Normal chest excursion without splinting or tachypnea; breath sounds clear and equal bilaterally; no wheezes, no rhonchi, no rales, no hypoxia or respiratory distress, speaking full sentences ABD/GI: Normal bowel sounds; non-distended; soft, non-tender, no rebound, no guarding, no peritoneal signs, no hepatosplenomegaly RECTAL:  Normal rectal tone, patient does have gross blood in her rectal vault but no active  hemorrhage, guaiac positive, no melena, multiple external hemorrhoids appreciated - one is large but not completely thrombosed, mildly tender rectal exam, no fecal impaction BACK:  The back appears normal and is non-tender to palpation, there is no CVA tenderness EXT: Normal ROM in all joints; non-tender to palpation; no edema; normal capillary refill; no cyanosis, no calf tenderness or swelling    SKIN: Normal color for age and race; warm; no rash NEURO: Moves all extremities equally, sensation to light touch intact diffusely, cranial nerves II through XII intact, normal speech PSYCH: The patient's mood and manner are appropriate. Grooming and personal hygiene are appropriate.  MEDICAL DECISION MAKING: Patient here with rectal bleeding. Discussed with her that this may be from her hemorrhoids but she does have blood in her rectal vault. Currently she is not hemorrhaging and she has been hemodynamically stable with normal heart rate and blood pressure. Abdominal exam is completely benign. Labs obtained in triage show a hemoglobin of 15.3. Discussed this with patient and discussed with her that I feel she can be followed as an outpatient with her gastroenterologist. Patient is very worried about this and states she would like to be admitted to the hospital. Discussed this with Dr. Sharl Ma who agrees patient can be discharged but he will see her in consult. I appreciate his help.  ED PROGRESS: I have added on coags to patient's lab work and they appear normal.    12:50 AM  Pt seen and evaluated by Dr. Sharl Ma.  He agrees patient can be discharged home. Patient is now comfortable with this plan for discharge. We'll discharge with outpatient follow-up with gastroenterology. Have recommended rectal suppositories for her hemorrhoids. She currently reports soft stool but have recommended increased water and fiber intake and over-the-counter Colace and MiraLAX if she begins to be constipated. I have discussed bleeding  return precautions with patient and her friend at bedside. They're comfortable with this plan.   At this time, I do not feel there is any life-threatening condition present. I have reviewed and discussed all results (EKG, imaging, lab, urine as appropriate) and exam findings with patient/family. I have reviewed nursing notes and appropriate previous records.  I feel the patient is safe to be discharged home without further emergent workup and can continue workup as an outpatient as needed. Discussed usual and customary return precautions. Patient/family verbalize understanding and are comfortable with this plan.  Outpatient follow-up has been provided. All questions have been answered.     I personally performed the services described in this documentation, which was scribed in my presence. The recorded information has been reviewed and is accurate.    Layla Maw Ward, DO 12/14/16 [phone]

## 2016-12-13 NOTE — ED Notes (Signed)
ED Provider at bedside. 

## 2016-12-13 NOTE — ED Notes (Signed)
Pt states that she has been having problems with ?hemorroid for the past few days, thought that the area had gotten bigger, tonight started having bright red bleeding from rectal area and dizziness,

## 2016-12-13 NOTE — ED Triage Notes (Signed)
Pt here for bright red rectal bleeding that started approximately 15 minutes ago. Pt also c/o dizziness.

## 2016-12-14 DIAGNOSIS — K649 Unspecified hemorrhoids: Secondary | ICD-10-CM

## 2016-12-14 DIAGNOSIS — K625 Hemorrhage of anus and rectum: Secondary | ICD-10-CM | POA: Diagnosis not present

## 2016-12-14 LAB — POC OCCULT BLOOD, ED: FECAL OCCULT BLD: POSITIVE — AB

## 2016-12-14 LAB — PROTIME-INR
INR: 0.98
PROTHROMBIN TIME: 13 s (ref 11.4–15.2)

## 2016-12-14 LAB — TYPE AND SCREEN
ABO/RH(D): B POS
Antibody Screen: NEGATIVE

## 2016-12-14 LAB — APTT: APTT: 29 s (ref 24–36)

## 2016-12-14 MED ORDER — HYDROCORTISONE ACETATE 25 MG RE SUPP: 25.0000 mg | Freq: Two times a day (BID) | RECTAL | 0 refills | Status: DC

## 2016-12-14 MED ORDER — SODIUM CHLORIDE 0.9 % IV BOLUS (SEPSIS)
1000.0000 mL | Freq: Once | INTRAVENOUS | Status: AC
  Administered 2016-12-14: 1000 mL via INTRAVENOUS

## 2016-12-14 NOTE — Discharge Instructions (Signed)
Please call your gastroenterologist for close follow-up. Please increase your water and fiber intake to keep your stool soft. If you began having constipation, I recommended MiraLAX and Colace over-the-counter. You may use suppositories to help with your hemorrhoid pain and swelling.

## 2016-12-14 NOTE — ED Notes (Signed)
Pt family out to nursing desk and advised that pt is requesting to go home, Dr Sharl Ma paged and informed of pt's decision,

## 2016-12-14 NOTE — ED Notes (Signed)
Pt and family updated on plan of care,  

## 2016-12-14 NOTE — ED Notes (Signed)
Pt states understanding of care given and follow up instructions.  Pt a/o, ambulated from ED with steady gait.  Family to escort home

## 2016-12-14 NOTE — Consult Note (Addendum)
[facility] H&P    Patient Demographics:    Annette Larsen, is a 46 y.o. female  MRN: [id number]  DOB - 1970/11/13  Admit Date - 12/13/2016  Requesting  MD: Dr Elesa Massed  Outpatient Primary MD for the patient is FULP, CAMMIE, MD  Patient coming from: home  Chief Complaint  Patient presents with  . Rectal Bleeding      HPI:    Annette Larsen  is a 46 y.o. female, With history of hemorrhoids came to ED with one episode of rectal bleeding this morning. Patient says that she has had hemorrhoids but never had bleeding like this before. She denies nausea vomiting or diarrhea. But did have loose stool 1 this morning. Also patient says that she has been having alternating constipation and diarrhea over past several weeks. Last time she was constipated was last week. She denies abdominal pain at this time, but does have history of abdominal cramping. She denies fever, no chest pain or shortness of breath.  In the ED lab work showed hemoglobin 15.3, blood pressure 156/95. WBC 10.1, platelet 277.    Review of systems:        A full 10 point Review of Systems was done, except as stated above, all other Review of Systems were negative.   With Past History of the following :    Past Medical History:  Diagnosis Date  . Acne   . Acne   . Benign cellular changes seen on Pap smear 04/2010  . Condyloma 09/2007  . Cyst of right Bartholin's gland 2015  . IBS (irritable bowel syndrome)   . LGSIL (low grade squamous intraepithelial dysplasia)    04/2007, 04/2009,       Past Surgical History:  Procedure Laterality Date  . ENDOMETRIAL ABLATION  2005  . FLEXIBLE SIGMOIDOSCOPY  03/15/2008  . TUBAL LIGATION  1999      Social History:      Social History  Substance Use Topics  . Smoking status: Never Smoker  . Smokeless tobacco: Never Used  . Alcohol use No       Family History :     Family History    Problem Relation Age of Onset  . Diabetes Brother 105    died with complications from diabetes  . Hypertension Father   . Heart disease Father   . Diabetes Mother   . Migraines Mother       Home Medications:   Prior to Admission medications   Medication Sig Start Date End Date Taking? Authorizing Provider  ACANYA gel  09/06/14   Historical Provider, MD  Cholecalciferol (VITAMIN D3) 5000 UNITS CAPS Take by mouth.    Historical Provider, MD  doxycycline (VIBRAMYCIN) 100 MG capsule Take 1 capsule by mouth daily. 07/20/15   Historical Provider, MD  fluocinonide (LIDEX) 0.05 % external solution  09/06/14   Historical Provider, MD     Allergies:    No Known Allergies   Physical Exam:   Vitals  Blood pressure 156/95, pulse 90, temperature 97.8 F (36.6 C), temperature source Oral,  resp. rate 20, height 5\' 6"  (1.676 m), weight 79.4 kg (175 lb), SpO2 100 %.  1.  General: Appears in no acute distress  2. Psychiatric:  Intact judgement and  insight, awake alert, oriented x 3.  3. Neurologic: No focal neurological deficits, all cranial nerves intact.Strength 5/5 all 4 extremities, sensation intact all 4 extremities, plantars down going.  4. Eyes :  anicteric sclerae, moist conjunctivae with no lid lag. PERRLA.  5. ENMT:  Oropharynx clear with moist mucous membranes and good dentition  6. Neck:  supple, no cervical lymphadenopathy appriciated, No thyromegaly  7. Respiratory : Normal respiratory effort, good air movement bilaterally,clear to  auscultation bilaterally  8. Cardiovascular : RRR, no gallops, rubs or murmurs, no leg edema  9. Gastrointestinal:  Positive bowel sounds, abdomen soft, non-tender to palpation,no hepatosplenomegaly, no rigidity or guarding. Rectal exam deferred, done by ED physician Dr Elesa Massed.       10. Skin:  No cyanosis, normal texture and turgor, no rash, lesions or ulcers  11.Musculoskeletal:  Good muscle tone,  joints appear normal , no  effusions,  normal range of motion    Data Review:    CBC  Recent Labs Lab 12/13/16 2233  WBC 10.1  HGB 15.3*  HCT 43.9  PLT 277  MCV 88.2  MCH 30.7  MCHC 34.9  RDW 13.2  LYMPHSABS 3.5  MONOABS 0.8  EOSABS 0.1  BASOSABS 0.1   ------------------------------------------------------------------------------------------------------------------  Chemistries   Recent Labs Lab 12/13/16 2233  NA 138  K 3.5  CL 104  CO2 25  GLUCOSE 132*  BUN 8  CREATININE 0.97  CALCIUM 9.4   ------------------------------------------------------------------------------------------------------------------  ------------------------------------------------------------------------------------------------------------------ GFR: Estimated Creatinine Clearance: 79.5 mL/min (by C-G formula based on SCr of 0.97 mg/dL). Liver Function Tests: No results for input(s): AST, ALT, ALKPHOS, BILITOT, PROT, ALBUMIN in the last 168 hours. No results for input(s): LIPASE, AMYLASE in the last 168 hours. No results for input(s): AMMONIA in the last 168 hours. Coagulation Profile:  Recent Labs Lab 12/12/16 2233  INR 0.98    --------------------------------------------------------------------------------------------------------------- Urine analysis:    Component Value Date/Time   COLORURINE YELLOW 04/26/2012 1600   APPEARANCEUR CLEAR 04/26/2012 1600   LABSPEC 1.006 04/26/2012 1600   PHURINE 6.0 04/26/2012 1600   GLUCOSEU NEG 04/26/2012 1600   HGBUR NEG 04/26/2012 1600   BILIRUBINUR n 08/08/2015 1023   KETONESUR NEG 04/26/2012 1600   PROTEINUR n 08/08/2015 1023   PROTEINUR NEG 04/26/2012 1600   UROBILINOGEN negative 08/08/2015 1023   UROBILINOGEN 0.2 04/26/2012 1600   NITRITE n 08/08/2015 1023   NITRITE NEG 04/26/2012 1600   LEUKOCYTESUR Negative 08/08/2015 1023      Imaging Results:      Assessment & Plan:   1. Rectal bleeding- patient has one episode of rectal bleeding this  morning, likely from hemorrhoidal bleed. Patient's hemoglobin is stable, hemodynamically she is stable. Can be discharged home on Anusol suppository, follow-up with GI as outpatient.      Meredeth Ide M.D on 12/14/2016 at 12:51 AM  Between 7am to 7pm - Pager - [phone]. After 7pm go to [email or web address] - password [facility]  [facility] - Office  [phone]

## 2016-12-14 NOTE — ED Notes (Signed)
Dr Lama at bedside,  

## 2017-02-01 ENCOUNTER — Emergency Department (HOSPITAL_COMMUNITY): Payer: 59

## 2017-02-01 ENCOUNTER — Emergency Department (HOSPITAL_COMMUNITY)
Admission: EM | Admit: 2017-02-01 | Discharge: 2017-02-01 | Disposition: A | Discharge status: Home / Self Care | Payer: 59 | Attending: Emergency Medicine | Admitting: Emergency Medicine

## 2017-02-01 ENCOUNTER — Encounter (HOSPITAL_COMMUNITY): Payer: Self-pay

## 2017-02-01 DIAGNOSIS — Y999 Unspecified external cause status: Secondary | ICD-10-CM | POA: Diagnosis not present

## 2017-02-01 DIAGNOSIS — S161XXA Strain of muscle, fascia and tendon at neck level, initial encounter: Secondary | ICD-10-CM | POA: Diagnosis not present

## 2017-02-01 DIAGNOSIS — Y9389 Activity, other specified: Secondary | ICD-10-CM | POA: Insufficient documentation

## 2017-02-01 DIAGNOSIS — S199XXA Unspecified injury of neck, initial encounter: Secondary | ICD-10-CM | POA: Diagnosis present

## 2017-02-01 DIAGNOSIS — Y92410 Unspecified street and highway as the place of occurrence of the external cause: Secondary | ICD-10-CM | POA: Diagnosis not present

## 2017-02-01 MED ORDER — METHOCARBAMOL 500 MG PO TABS: 500.0000 mg | ORAL_TABLET | Freq: Two times a day (BID) | ORAL | 0 refills | Status: DC

## 2017-02-01 MED ORDER — DICLOFENAC SODIUM 50 MG PO TBEC: 50.0000 mg | DELAYED_RELEASE_TABLET | Freq: Two times a day (BID) | ORAL | 0 refills | Status: DC
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
## 2017-02-01 NOTE — ED Notes (Signed)
Pt verbalized understanding of discharge instructions. Pt ambulatory to waiting room.  
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
## 2017-02-01 NOTE — ED Notes (Signed)
Patient transported to X-ray 

## 2017-06-01 ENCOUNTER — Ambulatory Visit (INDEPENDENT_AMBULATORY_CARE_PROVIDER_SITE_OTHER): Payer: 59 | Admitting: Neurology

## 2017-06-01 ENCOUNTER — Encounter: Payer: Self-pay | Admitting: Neurology

## 2017-06-01 VITALS — BP 127/81 | HR 73 | Ht 66.0 in | Wt 178.8 lb

## 2017-06-01 DIAGNOSIS — R200 Anesthesia of skin: Secondary | ICD-10-CM | POA: Diagnosis not present

## 2017-06-01 DIAGNOSIS — R202 Paresthesia of skin: Secondary | ICD-10-CM | POA: Diagnosis not present

## 2017-06-01 DIAGNOSIS — G629 Polyneuropathy, unspecified: Secondary | ICD-10-CM | POA: Diagnosis not present

## 2017-06-01 DIAGNOSIS — R29898 Other symptoms and signs involving the musculoskeletal system: Secondary | ICD-10-CM | POA: Diagnosis not present

## 2017-06-01 DIAGNOSIS — G35 Multiple sclerosis: Secondary | ICD-10-CM | POA: Diagnosis not present

## 2017-06-01 MED ORDER — ALPRAZOLAM 0.5 MG PO TABS: ORAL_TABLET | ORAL | 0 refills | Status: DC

## 2017-06-01 NOTE — Patient Instructions (Signed)
Remember to drink plenty of fluid, eat healthy meals and do not skip any meals. Try to eat protein with a every meal and eat a healthy snack such as fruit or nuts in between meals. Try to keep a regular sleep-wake schedule and try to exercise daily, particularly in the form of walking, 20-30 minutes a day, if you can.   As far as diagnostic testing: Labs, MRI brain, emg/ncs  I would like to see you back for emg/ncs, sooner if we need to. Please call us with any interim questions, concerns, problems, updates or refill requests.   Our phone number is [phone]. We also have an after hours call service for urgent matters and there is a physician on-call for urgent questions. For any emergencies you know to call 911 or go to the nearest emergency room

## 2017-06-01 NOTE — Progress Notes (Signed)
[facility]    Provider:  Dr Lucia Gaskins Referring Provider: Sheran Luz, MD Primary Care Physician:  Dr. Jillyn Hidden  CC:  Arm numbness  HPI:  Annette Larsen is a 46 y.o. female here as a referral from Dr. Ethelene Hal for right arm numbness in May or June. She woke up one night and her arm was "dead and numb", then the next day it felt tingly and weird. Started happening more and more, mostly at night. She has had weird things happening for a year or two. She has been having fatigue with a complete workup. Fatigue worsening in the last year. She saw a rheumatologist due to joint pain and fatigue. Father had autoimmune disorders, father has psoriasis. Continued to have right arm pain. She has intermittent joint pain. 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 But they are worsening as well. She had numbness in her left leg, went "dead". She has urination issues, she has urinated on herself, she has had vision changes and acute vision loss that resolves without headaches. She had an MRI cervical spine at dr. Ethelene Hal (dont have those records, will request from Dr. Ethelene Hal). She has headaches, usually just a dull headache. She does have a hx of cluster headaches.  Reviewed notes, labs and imaging from outside physicians, which showed:  Personally reviewed images and agree with the following: CT of the cervical spine 02/01/2017  IMPRESSION: Straightening of the cervical spine. No acute fracture or Malalignment.    Review of Systems: Patient complains of symptoms per HPI as well as the following symptoms: no CP, no SOB. Pertinent negatives and positives per HPI. All others negative.   Social History   Social History  . Marital status: Single    Spouse name: N/A  . Number of children: 2  . Years of education: Master's   Occupational History  . [employer]    Social History Main Topics  . Smoking  status: Never Smoker  . Smokeless tobacco: Never Used  . Alcohol use No  . Drug use: No  . Sexual activity: Yes    Partners: Male    Birth control/ protection: Surgical     Comment: TUBAL LIGATION   Other Topics Concern  . Not on file   Social History Narrative   Lives alone   Right-handed   Caffeine: sometimes 1 cup per day    Family History  Problem Relation Age of Onset  . Diabetes Brother 35       died with complications from diabetes  . Hypertension Father   . Heart disease Father   . Heart attack Father   . Diabetes Mother   . Migraines Mother     Past Medical History:  Diagnosis Date  . Acne   . Acne   . Benign cellular changes seen on Pap smear 04/2010  . Condyloma 09/2007  . Cyst of right Bartholin's gland 2015  . IBS (irritable bowel syndrome)   . LGSIL (low grade squamous intraepithelial dysplasia)    04/2007, 04/2009,     Past Surgical History:  Procedure Laterality Date  . ENDOMETRIAL ABLATION  2005  . FLEXIBLE SIGMOIDOSCOPY  03/15/2008  . TUBAL LIGATION  1999    Current Outpatient Prescriptions  Medication Sig Dispense Refill  . ACANYA gel   1  . Cholecalciferol (VITAMIN D3) 5000 UNITS CAPS Take by mouth.    . fluocinonide (LIDEX) 0.05 % external solution  0  . ibuprofen (ADVIL,MOTRIN) 600 MG tablet ibuprofen 600 mg tablet  take 1 tablet by mouth every 6 hours if needed    . ALPRAZolam (XANAX) 0.5 MG tablet Take 1-2 tablets 30-60 minutes before MRI. Can repeat if necessary. Do not drive. 5 tablet 0  . methocarbamol (ROBAXIN) 500 MG tablet Take 1 tablet (500 mg total) by mouth 2 (two) times daily. (Patient not taking: Reported on 06/01/2017) 20 tablet 0   No current facility-administered medications for this visit.     Allergies as of 06/01/2017  . (No Known Allergies)    Vitals: BP 127/81   Pulse 73   Ht 5\' 6"  (1.676 m)   Wt 178 lb 12.8 oz (81.1 kg)   BMI 28.86 kg/m  Last Weight:  Wt Readings from Last 1 Encounters:  06/01/17 178  lb 12.8 oz (81.1 kg)   Last Height:   Ht Readings from Last 1 Encounters:  06/01/17 5\' 6"  (1.676 m)    Physical exam: Exam: Gen: NAD, conversant, well nourised, obese, well groomed                     CV: RRR, no MRG. No Carotid Bruits. No peripheral edema, warm, nontender Eyes: Conjunctivae clear without exudates or hemorrhage  Neuro: Detailed Neurologic Exam  Speech:    Speech is normal; fluent and spontaneous with normal comprehension.  Cognition:    The patient is oriented to person, place, and time;     recent and remote memory intact;     language fluent;     normal attention, concentration,     fund of knowledge Cranial Nerves:    The pupils are equal, round, and reactive to light. The fundi are normal and spontaneous venous pulsations are present. Visual fields are full to finger confrontation. Extraocular movements are intact. Trigeminal sensation is intact and the muscles of mastication are normal. The face is symmetric. The palate elevates in the midline. Hearing intact. Voice is normal. Shoulder shrug is normal. The tongue has normal motion without fasciculations.   Coordination:    Normal finger to nose and heel to shin. Normal rapid alternating movements.   Gait:    Heel-toe and tandem gait are normal.   Motor Observation:    No asymmetry, no atrophy, and no involuntary movements noted. Tone:    Normal muscle tone.    Posture:    Posture is normal. normal erect    Strength: right arm proximal weakness mild otherwise strength is V/V in the upper and lower limbs.      Sensation: hyperesthesias in left foot     Reflex Exam:  DTR's:    Deep tendon reflexes in the upper and lower extremities are normal bilaterally.   Toes:    The toes are downgoing bilaterally.   Clonus:    Clonus is absent.      Assessment/Plan:  46 year old patient with multiple neurologic symptoms. Exam does show focal weakness and focal sensory changes. Need to request MRI of the  cervical spine results from Dr. Carolanne Grumbling which I did not receive the consult. However by report there was some mild degenerative changes but no cervical spine abnormalities, per patient. Need to evaluate for multiple sclerosis and this 46 year old woman with multiple neurologic symptoms and focal signs on exam.  Emg/ncs: of the right arm and left leg.  MRI of the brain w/wo contrast to evaluate for multiple sclerosis or other intra-cerebral etiologies. We'll order extensive lab work.  Orders Placed This Encounter  Procedures  . MR BRAIN W WO CONTRAST  . Angiotensin converting enzyme  . HIV antibody  . B12 and Folate Panel  . RPR  . Hepatitis C antibody  . Heavy metals, blood  . Vitamin B6  . B. burgdorfi Antibody  . Vitamin B1  . Methylmalonic acid, serum  . Basic Metabolic Panel  . NCV with EMG(electromyography)   Cc: Dr. Ethelene Hal, Dr. Jacobo Forest, MD  [facility] [address] [place], [place] [address]  Phone [phone] Fax [phone]

## 2017-06-04 ENCOUNTER — Telehealth: Payer: Self-pay

## 2017-06-04 NOTE — Telephone Encounter (Signed)
Requested and received faxed MRI cervical spine from [facility] which showed "small R subarticular C5-6 disc protrusion mildly narrows the R subarticular zone and contracts and likely mildly displaces coursing R ventral C6 nerve root. Mild-moderate bulging disc-osteophyte complex seen with minimal flattening of the cord w/o central stenosis. Mild broad central disc-osteophyte complex at C4-5 and C6-7 minimally indents upon ventral midline cord at C4-5 w/o central stenosis. Straightening of the normal cervical stenosis." Sent to med records for scanning, copy to Dr. Lucia Gaskins for review.

## 2017-06-05 LAB — ANGIOTENSIN CONVERTING ENZYME: ANGIO CONVERT ENZYME: 40 U/L (ref 14–82)

## 2017-06-05 LAB — VITAMIN B1: Thiamine: 134.4 nmol/L (ref 66.5–200.0)

## 2017-06-05 LAB — BASIC METABOLIC PANEL
BUN/Creatinine Ratio: 11 (ref 9–23)
BUN: 10 mg/dL (ref 6–24)
CO2: 23 mmol/L (ref 20–29)
CREATININE: 0.95 mg/dL (ref 0.57–1.00)
Calcium: 9.7 mg/dL (ref 8.7–10.2)
Chloride: 103 mmol/L (ref 96–106)
GFR calc Af Amer: 84 mL/min/{1.73_m2} (ref >59)
GFR, EST NON AFRICAN AMERICAN: 73 mL/min/{1.73_m2} (ref >59)
GLUCOSE: 81 mg/dL (ref 65–99)
Potassium: 4.3 mmol/L (ref 3.5–5.2)
Sodium: 140 mmol/L (ref 134–144)

## 2017-06-05 LAB — B12 AND FOLATE PANEL
FOLATE: 8.4 ng/mL (ref >3.0)
Vitamin B-12: 585 pg/mL (ref 232–1245)

## 2017-06-05 LAB — HEPATITIS C ANTIBODY: Hep C Virus Ab: <0.1 s/co ratio (ref 0.0–0.9)

## 2017-06-05 LAB — HEAVY METALS, BLOOD
ARSENIC: 5 ug/L (ref 2–23)
LEAD, BLOOD: NOT DETECTED ug/dL (ref 0–19)
MERCURY: NOT DETECTED ug/L (ref 0.0–14.9)

## 2017-06-05 LAB — VITAMIN B6

## 2017-06-05 LAB — METHYLMALONIC ACID, SERUM: Methylmalonic Acid: 79 nmol/L (ref 0–378)

## 2017-06-05 LAB — HIV ANTIBODY (ROUTINE TESTING W REFLEX): HIV Screen 4th Generation wRfx: NONREACTIVE

## 2017-06-05 LAB — RPR: RPR Ser Ql: NONREACTIVE

## 2017-06-05 LAB — B. BURGDORFI ANTIBODIES

## 2017-06-09 ENCOUNTER — Other Ambulatory Visit: Payer: 59

## 2017-06-10 ENCOUNTER — Encounter: Payer: Self-pay | Admitting: Neurology

## 2017-06-11 ENCOUNTER — Other Ambulatory Visit: Payer: Self-pay | Admitting: Neurology

## 2017-06-15 ENCOUNTER — Telehealth: Payer: Self-pay

## 2017-06-15 LAB — VITAMIN B6: VITAMIN B6: 8.5 ug/L (ref 2.0–32.8)

## 2017-06-15 NOTE — Telephone Encounter (Signed)
Called pt w/ lab results. She asked if MRI results were reviewed yet. Let pt know that results from scan are not yet in EPIC and that Dr. Lucia Gaskins is on vacation this week. Verbalized understanding. Plans to return to office at the end of this month for EMG/NCV.

## 2017-06-15 NOTE — Telephone Encounter (Signed)
-----   Message from Anson Fret, MD sent at 06/04/2017 10:03 AM EDT ----- All labs look fine. Will review at emg/ncs. thanks

## 2017-06-17 ENCOUNTER — Telehealth: Payer: Self-pay

## 2017-06-17 NOTE — Telephone Encounter (Signed)
Received faxed MRI results from [facility] which showed normal MRI of the brain. Sent to med records for scanning, copy to Dr. Lucia Gaskins for review.

## 2017-06-28 NOTE — Telephone Encounter (Signed)
Annette Larsen, please let patient know the MRI of her brain was normal. Thanks

## 2017-06-29 NOTE — Telephone Encounter (Signed)
Called and spoke with patient about normal MRI brain. Patient verbalized understanding. She has EMG/NCS 07/01/17

## 2017-06-30 ENCOUNTER — Telehealth: Payer: Self-pay | Admitting: *Deleted

## 2017-06-30 NOTE — Telephone Encounter (Signed)
-----   Message from Anson Fret, MD sent at 06/30/2017 11:11 AM EDT ----- Normal MRI brain thanks

## 2017-07-01 ENCOUNTER — Ambulatory Visit (INDEPENDENT_AMBULATORY_CARE_PROVIDER_SITE_OTHER): Payer: 59 | Admitting: Neurology

## 2017-07-01 ENCOUNTER — Ambulatory Visit (INDEPENDENT_AMBULATORY_CARE_PROVIDER_SITE_OTHER): Payer: Self-pay | Admitting: Neurology

## 2017-07-01 DIAGNOSIS — R253 Fasciculation: Secondary | ICD-10-CM

## 2017-07-01 DIAGNOSIS — Z0289 Encounter for other administrative examinations: Secondary | ICD-10-CM

## 2017-07-01 MED ORDER — PROPRANOLOL HCL 10 MG PO TABS: 10.0000 mg | ORAL_TABLET | Freq: Three times a day (TID) | ORAL | 6 refills | Status: DC | PRN

## 2017-07-01 NOTE — Procedures (Signed)
Full Name: Annette Larsen Gender: Female MRN #: [id number] Date of Birth: 09-Feb-1971    Visit Date: 07/01/2017 10:49 Age: 46 Years 3 Months Old Examining Physician: Naomie Dean, MD   History: Patient is here for evaluation of right arm numbness and weakness, benign fasciculations, left leg numbness. MRI of the brain and cervical spine unremarkable. Extensive serum neurologic workup unremarkable.  Summary: The right median orthodromic sensory nerve showed delayed distal peak latency (3.4 ms, N<3.4). The right median/ulnar (palm) comparison nerve showed prolonged distal peak latency (Median Palm, 2.6 ms, N<2.2) and abnormal peak latency difference (Median Palm-Ulnar Palm, 0.4 ms, N<0.4) with a relative median delay. All remaining nerves (as detailed in the tables below) were within normal limits.All muscles (as detailed in the tables below) were within normal limits.     Conclusion: There is electrophysiologic evidence for mild right carpal tunnel syndrome. EMG nerve conduction study of the left lower extremity was normal.   Naomie Dean M.D.  [facility] [address] [place], [place] [address] Tel: [phone] Fax: [phone]        [facility]    Nerve / Sites Muscle Latency Ref. Amplitude Ref. Rel Amp Segments Distance Velocity Ref. Area    ms ms mV mV %  cm m/s m/s mVms  R Median - APB     Wrist APB 3.8 ?4.4 10.1 ?4.0 100 Wrist - APB 7   43.7     Upper arm APB 7.7  9.9  98.1 Upper arm - Wrist 21 54 ?49 42.3  R Ulnar - ADM     Wrist ADM 3.2 ?3.3 8.5 ?6.0 100 Wrist - ADM 7   32.6     B.Elbow ADM 6.9  8.1  95.6 B.Elbow - Wrist 20 54 ?49 34.6     A.Elbow ADM 8.6  8.0  98.9 A.Elbow - B.Elbow 10 58 ?49 32.0         A.Elbow - Wrist      L Peroneal - EDB     Ankle EDB 4.9 ?6.5 9.7 ?2.0 100 Ankle - EDB 9   34.0     Fib head EDB 10.9  9.3  96.2 Fib head - Ankle 29 48 ?44 31.6     Pop fossa EDB 13.6  7.0  74.7 Pop fossa - Fib head 12 45 ?44 24.6         Pop fossa  - Ankle      L Tibial - AH     Ankle AH 5.0 ?5.8 18.6 ?4.0 100 Ankle - AH 9   53.7     Pop fossa AH 13.0  15.9  85.3 Pop fossa - Ankle 34 43 ?41 55.9             SNC    Nerve / Sites Rec. Site Peak Lat Ref.  Amp Ref. Segments Distance Peak Diff Ref.    ms ms V V  cm ms ms  L Sural - Ankle (Calf)     Calf Ankle 3.8 ?4.4 8 ?6 Calf - Ankle 14    L Superficial peroneal - Ankle     Lat leg Ankle 3.6 ?4.4 7 ?6 Lat leg - Ankle 14    R Median, Ulnar - Transcarpal comparison     Median Palm Wrist 2.6 ?2.2 64 ?35 Median Palm - Wrist 8       Ulnar Palm Wrist 2.1 ?2.2 23 ?12 Ulnar Palm - Wrist 8  Median Palm - Ulnar Palm  0.4 ?0.4  R Median - Orthodromic (Dig II, Mid palm)     Dig II Wrist 3.4 ?3.4 23 ?10 Dig II - Wrist 13    R Ulnar - Orthodromic, (Dig V, Mid palm)     Dig V Wrist 2.7 ?3.1 12 ?5 Dig V - Wrist 49                 F  Wave    Nerve F Lat Ref.   ms ms  R Ulnar - ADM 27.4 ?32.0  L Tibial - AH 51.5 ?56.0         EMG full       EMG Summary Table    Spontaneous MUAP Recruitment  Muscle IA Fib PSW Fasc Other Amp Dur. Poly Pattern  R. Biceps brachii Normal None None None _______ Normal Normal Normal Normal  R. Deltoid Normal None None None _______ Normal Normal Normal Normal  R. Triceps brachii Normal None None None _______ Normal Normal Normal Normal  R. Pronator teres Normal None None None _______ Normal Normal Normal Normal  R. First dorsal interosseous Normal None None None _______ Normal Normal Normal Normal  R. Opponens pollicis Normal None None None _______ Normal Normal Normal Normal  R. Cervical paraspinals (low) Normal None None None _______ Normal Normal Normal Normal  L. Vastus medialis Normal None None None _______ Normal Normal Normal Normal  L. Gastrocnemius (Medial head) Normal None None None _______ Normal Normal Normal Normal  L. Tibialis anterior Normal None None None _______ Normal Normal Normal Normal  L. Biceps femoris (long head) Normal None  None None _______ Normal Normal Normal Normal  L. Gluteus medius Normal None None None _______ Normal Normal Normal Normal  L. Gluteus maximus Normal None None None _______ Normal Normal Normal Normal

## 2017-07-01 NOTE — Progress Notes (Signed)
See procedure note.

## 2017-07-01 NOTE — Progress Notes (Signed)
     Full Name: Annette Larsen Gender: Female MRN #: [id number] Date of Birth: 12/10/1970    Visit Date: 07/01/2017 10:49 Age: 46 Years 3 Months Old Examining Physician: Jaquavian Firkus, MD   History: Patient is here for evaluation of right arm numbness and weakness, benign fasciculations, left leg numbness. MRI of the brain and cervical spine unremarkable. Extensive serum neurologic workup unremarkable.  Summary: The right median orthodromic sensory nerve showed delayed distal peak latency (3.4 ms, N<3.4). The right median/ulnar (palm) comparison nerve showed prolonged distal peak latency (Median Palm, 2.6 ms, N<2.2) and abnormal peak latency difference (Median Palm-Ulnar Palm, 0.4 ms, N<0.4) with a relative median delay. All remaining nerves (as detailed in the tables below) were within normal limits.All muscles (as detailed in the tables below) were within normal limits.     Conclusion: There is electrophysiologic evidence for mild right carpal tunnel syndrome. EMG nerve conduction study of the left lower extremity was normal.   Matie Dimaano M.D.  [facility] [address] [place], [place] [address] Tel: [phone] Fax: [phone]        MNC    Nerve / Sites Muscle Latency Ref. Amplitude Ref. Rel Amp Segments Distance Velocity Ref. Area    ms ms mV mV %  cm m/s m/s mVms  R Median - APB     Wrist APB 3.8 ?4.4 10.1 ?4.0 100 Wrist - APB 7   43.7     Upper arm APB 7.7  9.9  98.1 Upper arm - Wrist 21 54 ?49 42.3  R Ulnar - ADM     Wrist ADM 3.2 ?3.3 8.5 ?6.0 100 Wrist - ADM 7   32.6     B.Elbow ADM 6.9  8.1  95.6 B.Elbow - Wrist 20 54 ?49 34.6     A.Elbow ADM 8.6  8.0  98.9 A.Elbow - B.Elbow 10 58 ?49 32.0         A.Elbow - Wrist      L Peroneal - EDB     Ankle EDB 4.9 ?6.5 9.7 ?2.0 100 Ankle - EDB 9   34.0     Fib head EDB 10.9  9.3  96.2 Fib head - Ankle 29 48 ?44 31.6     Pop fossa EDB 13.6  7.0  74.7 Pop fossa - Fib head 12 45 ?44 24.6         Pop fossa  - Ankle      L Tibial - AH     Ankle AH 5.0 ?5.8 18.6 ?4.0 100 Ankle - AH 9   53.7     Pop fossa AH 13.0  15.9  85.3 Pop fossa - Ankle 34 43 ?41 55.9             SNC    Nerve / Sites Rec. Site Peak Lat Ref.  Amp Ref. Segments Distance Peak Diff Ref.    ms ms V V  cm ms ms  L Sural - Ankle (Calf)     Calf Ankle 3.8 ?4.4 8 ?6 Calf - Ankle 14    L Superficial peroneal - Ankle     Lat leg Ankle 3.6 ?4.4 7 ?6 Lat leg - Ankle 14    R Median, Ulnar - Transcarpal comparison     Median Palm Wrist 2.6 ?2.2 64 ?35 Median Palm - Wrist 8       Ulnar Palm Wrist 2.1 ?2.2 23 ?12 Ulnar Palm - Wrist 8            Median Palm - Ulnar Palm  0.4 ?0.4  R Median - Orthodromic (Dig II, Mid palm)     Dig II Wrist 3.4 ?3.4 23 ?10 Dig II - Wrist 13    R Ulnar - Orthodromic, (Dig V, Mid palm)     Dig V Wrist 2.7 ?3.1 12 ?5 Dig V - Wrist 11                 F  Wave    Nerve F Lat Ref.   ms ms  R Ulnar - ADM 27.4 ?32.0  L Tibial - AH 51.5 ?56.0         EMG full       EMG Summary Table    Spontaneous MUAP Recruitment  Muscle IA Fib PSW Fasc Other Amp Dur. Poly Pattern  R. Biceps brachii Normal None None None _______ Normal Normal Normal Normal  R. Deltoid Normal None None None _______ Normal Normal Normal Normal  R. Triceps brachii Normal None None None _______ Normal Normal Normal Normal  R. Pronator teres Normal None None None _______ Normal Normal Normal Normal  R. First dorsal interosseous Normal None None None _______ Normal Normal Normal Normal  R. Opponens pollicis Normal None None None _______ Normal Normal Normal Normal  R. Cervical paraspinals (low) Normal None None None _______ Normal Normal Normal Normal  L. Vastus medialis Normal None None None _______ Normal Normal Normal Normal  L. Gastrocnemius (Medial head) Normal None None None _______ Normal Normal Normal Normal  L. Tibialis anterior Normal None None None _______ Normal Normal Normal Normal  L. Biceps femoris (long head) Normal None  None None _______ Normal Normal Normal Normal  L. Gluteus medius Normal None None None _______ Normal Normal Normal Normal  L. Gluteus maximus Normal None None None _______ Normal Normal Normal Normal      

## 2017-07-02 ENCOUNTER — Telehealth: Payer: Self-pay | Admitting: *Deleted

## 2017-07-02 NOTE — Telephone Encounter (Signed)
Request fax to [facility] requesting pt MRI or CT cervical on disk.

## 2017-07-12 NOTE — Telephone Encounter (Signed)
Rc pt cd from [employer] on 07/12/17. cd on Dr. Lucia Gaskins desk.

## 2017-07-14 ENCOUNTER — Other Ambulatory Visit: Payer: Self-pay | Admitting: Obstetrics and Gynecology

## 2017-07-14 DIAGNOSIS — Z1231 Encounter for screening mammogram for malignant neoplasm of breast: Secondary | ICD-10-CM

## 2017-07-19 ENCOUNTER — Ambulatory Visit: Payer: 59

## 2017-07-30 ENCOUNTER — Ambulatory Visit
Admission: RE | Admit: 2017-07-30 | Discharge: 2017-07-30 | Disposition: A | Discharge status: Home / Self Care | Payer: 59 | Source: Ambulatory Visit | Attending: Obstetrics and Gynecology | Admitting: Obstetrics and Gynecology

## 2017-07-30 DIAGNOSIS — Z1231 Encounter for screening mammogram for malignant neoplasm of breast: Secondary | ICD-10-CM

## 2017-08-27 ENCOUNTER — Encounter: Payer: Self-pay | Admitting: Obstetrics and Gynecology

## 2017-08-27 ENCOUNTER — Ambulatory Visit (INDEPENDENT_AMBULATORY_CARE_PROVIDER_SITE_OTHER): Payer: 59 | Admitting: Obstetrics and Gynecology

## 2017-08-27 ENCOUNTER — Other Ambulatory Visit (HOSPITAL_COMMUNITY)
Admission: RE | Admit: 2017-08-27 | Discharge: 2017-08-27 | Disposition: A | Discharge status: Home / Self Care | Payer: 59 | Source: Ambulatory Visit | Attending: Obstetrics and Gynecology | Admitting: Obstetrics and Gynecology

## 2017-08-27 VITALS — BP 110/72 | HR 64 | Resp 16 | Ht 65.5 in | Wt 179.8 lb

## 2017-08-27 DIAGNOSIS — Z01419 Encounter for gynecological examination (general) (routine) without abnormal findings: Secondary | ICD-10-CM | POA: Insufficient documentation

## 2017-08-27 NOTE — Patient Instructions (Signed)

## 2017-08-27 NOTE — Progress Notes (Signed)
46 y.o. G2P2 Single African American female here for annual exam.    Brown spotting a little bit.  This occurs every month.  Has back pain which is manageable.  Patient has had an ablation.   Wants a pap today.   Had a thrombosed hemorrhoid earlier year.   Does labs at work.  Told they were normal.  Flu shot done at pharmacy.  ROS - muscle/joint pain.   PCP: None    Patient's last menstrual period was 08/23/2017 (exact date).           Sexually active: Yes.   female The current method of family planning is tubal ligation.    Exercising: Yes.    walking Smoker:  no  Health Maintenance: Pap: 08-20-16 Neg:Neg HR HPV, 08-08-15 Neg:Neg HR HPV,  History of abnormal Pap:  Yes,2008, 2011 LSIL:no treatment to cervix, only repeat paps which reverted to normal.  MMG: 07-30-17 Density B/Neg/BiRads1:TBC Colonoscopy:   Dr. Bosie ClosSchooler at [facility] in March 2018.  Told next coloscopy in 10 years.  BMD:   n/a  Result  n/a TDaP:  2011 Gardasil:   no HIV: 06-01-17 NR Hep C: 06-01-17 Neg Screening Labs:  Hb today: labs with wellness at work, Urine today: not done   reports that she has never smoked. She has never used smokeless tobacco. She reports that she does not drink alcohol or use drugs.  Past Medical History:  Diagnosis Date  . Acne   . Acne   . Benign cellular changes seen on Pap smear 04/2010  . Condyloma 09/2007  . Cyst of right Bartholin's gland 2015  . IBS (irritable bowel syndrome)   . LGSIL (low grade squamous intraepithelial dysplasia)    04/2007, 04/2009,     Past Surgical History:  Procedure Laterality Date  . ENDOMETRIAL ABLATION  2005  . FLEXIBLE SIGMOIDOSCOPY  03/15/2008  . thrombosed hemorrhoid  12/2016  . TUBAL LIGATION  1999    Current Outpatient Prescriptions  Medication Sig Dispense Refill  . chlorhexidine (PERIDEX) 0.12 % solution RINSE WITH 15 ML FOR 30 SECONDS TWICE A DAY, AFTER BRUSHING AND F...  (REFER TO PRESCRIPTION NOTES).  0  . Cholecalciferol  (VITAMIN D3) 5000 UNITS CAPS Take by mouth.    . clobetasol ointment (TEMOVATE) 0.05 % clobetasol 0.05 % topical ointment    . fluocinonide (LIDEX) 0.05 % external solution   0  . ibuprofen (ADVIL,MOTRIN) 600 MG tablet ibuprofen 600 mg tablet  take 1 tablet by mouth every 6 hours if needed    . ketoconazole (NIZORAL) 2 % shampoo ketoconazole 2 % shampoo  apply as directed twice a day 2-3 TIMES A WEEK    . methocarbamol (ROBAXIN) 500 MG tablet Take 1 tablet (500 mg total) by mouth 2 (two) times daily. 20 tablet 0  . propranolol (INDERAL) 10 MG tablet Take 1 tablet (10 mg total) by mouth 3 (three) times daily as needed. 90 tablet 6   No current facility-administered medications for this visit.     Family History  Problem Relation Age of Onset  . Diabetes Brother [id number]       died with complications from diabetes  . Hypertension Father   . Heart disease Father   . Heart attack Father   . Diabetes Mother   . Migraines Mother     ROS:  Pertinent items are noted in HPI.  Otherwise, a comprehensive ROS was negative.  Exam:   BP 110/72 (BP Location: Right Arm, Patient Position: Sitting, Cuff  Size: Normal)   Pulse 64   Resp 16   Ht 5' 5.5" (1.664 m)   Wt 179 lb 12.8 oz (81.6 kg)   LMP 08/23/2017 (Exact Date)   BMI 29.47 kg/m     General appearance: alert, cooperative and appears stated age Head: Normocephalic, without obvious abnormality, atraumatic Neck: no adenopathy, supple, symmetrical, trachea midline and thyroid normal to inspection and palpation Lungs: clear to auscultation bilaterally Breasts: normal appearance, no masses or tenderness, No nipple retraction or dimpling, No nipple discharge or bleeding, No axillary or supraclavicular adenopathy Heart: regular rate and rhythm Abdomen: soft, non-tender; no masses, no organomegaly Extremities: extremities normal, atraumatic, no cyanosis or edema Skin: Skin color, texture, turgor normal. No rashes or lesions Lymph nodes: Cervical,  supraclavicular, and axillary nodes normal. No abnormal inguinal nodes palpated Neurologic: Grossly normal  Pelvic: External genitalia:  no lesions              Urethra:  normal appearing urethra with no masses, tenderness or lesions              Bartholins and Skenes: normal                 Vagina: normal appearing vagina with normal color and discharge, no lesions              Cervix: no lesions              Pap taken: Yes.   Bimanual Exam:  Uterus:  normal size, contour, position, consistency, mobility, non-tender              Adnexa: no mass, fullness, tenderness              Rectal exam: Yes.  .  Confirms.              Anus:  normal sphincter tone, no lesions  Chaperone was present for exam.  Assessment:   Well woman visit with normal exam. 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  Status post BTL.  Status post endometrial ablation.  History of HSV 1.    Plan: Mammogram screening discussed. Recommended self breast awareness. Pap and HR HPV as above. Guidelines for Calcium, Vitamin D, regular exercise program including cardiovascular and weight bearing exercise. I discussed Gardasil vaccine.  She will consider.  List of PCPs to patient.  Follow up annually and prn.   After visit summary provided.

## 2017-08-31 LAB — CYTOLOGY - PAP
DIAGNOSIS: NEGATIVE
HPV (WINDOPATH): NOT DETECTED

## 2017-11-29 ENCOUNTER — Telehealth: Payer: Self-pay | Admitting: Obstetrics and Gynecology

## 2017-11-29 NOTE — Telephone Encounter (Signed)
Left message to call Kaitlyn at [phone]. 

## 2017-11-29 NOTE — Telephone Encounter (Signed)
Patient has a vaginal infection she would like to be seen for today. She is going out of town tomorrow.

## 2017-12-13 NOTE — Telephone Encounter (Signed)
Dr.Silva, okay to complete this encounter? Patient has not returned call.

## 2018-08-10 ENCOUNTER — Other Ambulatory Visit: Payer: Self-pay | Admitting: Obstetrics and Gynecology

## 2018-08-10 DIAGNOSIS — Z1231 Encounter for screening mammogram for malignant neoplasm of breast: Secondary | ICD-10-CM

## 2018-09-23 ENCOUNTER — Ambulatory Visit
Admission: RE | Admit: 2018-09-23 | Discharge: 2018-09-23 | Disposition: A | Discharge status: Home / Self Care | Payer: BLUE CROSS/BLUE SHIELD | Source: Ambulatory Visit | Attending: Obstetrics and Gynecology | Admitting: Obstetrics and Gynecology

## 2018-09-23 DIAGNOSIS — Z1231 Encounter for screening mammogram for malignant neoplasm of breast: Secondary | ICD-10-CM

## 2018-12-28 ENCOUNTER — Other Ambulatory Visit: Payer: Self-pay

## 2018-12-28 ENCOUNTER — Ambulatory Visit (INDEPENDENT_AMBULATORY_CARE_PROVIDER_SITE_OTHER): Payer: BLUE CROSS/BLUE SHIELD | Admitting: Obstetrics and Gynecology

## 2018-12-28 ENCOUNTER — Encounter: Payer: Self-pay | Admitting: Obstetrics and Gynecology

## 2018-12-28 ENCOUNTER — Other Ambulatory Visit (HOSPITAL_COMMUNITY)
Admission: RE | Admit: 2018-12-28 | Discharge: 2018-12-28 | Disposition: A | Discharge status: Home / Self Care | Payer: BLUE CROSS/BLUE SHIELD | Source: Ambulatory Visit | Attending: Obstetrics and Gynecology | Admitting: Obstetrics and Gynecology

## 2018-12-28 VITALS — BP 128/80 | HR 66 | Resp 16 | Ht 65.5 in | Wt 192.8 lb

## 2018-12-28 DIAGNOSIS — Z113 Encounter for screening for infections with a predominantly sexual mode of transmission: Secondary | ICD-10-CM

## 2018-12-28 DIAGNOSIS — Z01419 Encounter for gynecological examination (general) (routine) without abnormal findings: Secondary | ICD-10-CM

## 2018-12-28 NOTE — Patient Instructions (Signed)

## 2018-12-28 NOTE — Progress Notes (Signed)
48 y.o. G2P2 Single [place] American female here for annual exam.    Patient wants STD testing today.  Work stress at [employer].   Saw a neurologist for muscle twitches and joint pain.   Blood work through PCP.   PCP: [facility]  Patient's last menstrual period was 12/27/2018 (exact date).     Period Cycle (Days): 30 Period Duration (Days): 4 days Period Pattern: Regular Menstrual Flow: Light Menstrual Control: Panty liner Dysmenorrhea: (!) Mild Dysmenorrhea Symptoms: Headache(only occ. headache)     Sexually active: Yes.   female The current method of family planning is tubal ligation/ablation.    Exercising: No.  The patient does not participate in regular exercise at present. Smoker:  no  Health Maintenance: Pap: 08-27-17 Neg:Neg HR HPV, 08-20-16 Neg:Neg HR HPV History of abnormal Pap:  Yes, ,2008, 2011 LSIL:no treatment to cervix, only repeat paps which reverted to normal.  MMG: 09-23-18 3D Neg/density B/BiRads1 Colonoscopy: 2018 normal;next due 2028 BMD:   n/a  Result  n/a TDaP:  2011 Gardasil:   no HIV: 06-01-17 NR Hep C:06-01-17 Neg Screening Labs:  Hb today: PCP   reports that she has never smoked. She has never used smokeless tobacco. She reports that she does not drink alcohol or use drugs.  Past Medical History:  Diagnosis Date  . Acne   . Acne   . Benign cellular changes seen on Pap smear 04/2010  . Condyloma 09/2007  . Cyst of right Bartholin's gland 2015  . IBS (irritable bowel syndrome)   . LGSIL (low grade squamous intraepithelial dysplasia)    04/2007, 04/2009,     Past Surgical History:  Procedure Laterality Date  . ENDOMETRIAL ABLATION  2005  . FLEXIBLE SIGMOIDOSCOPY  03/15/2008  . thrombosed hemorrhoid  12/2016  . TUBAL LIGATION  1999    Current Outpatient Medications  Medication Sig Dispense Refill  . clobetasol ointment (TEMOVATE) 0.05 % clobetasol 0.05 % topical ointment    . fluocinonide (LIDEX) 0.05 % external solution   0   . ketoconazole (NIZORAL) 2 % shampoo ketoconazole 2 % shampoo  apply as directed twice a day 2-3 TIMES A WEEK    . Cholecalciferol (VITAMIN D3) 5000 UNITS CAPS Take by mouth.     No current facility-administered medications for this visit.     Family History  Problem Relation Age of Onset  . Diabetes Brother 26       died with complications from diabetes  . Hypertension Father   . Heart disease Father   . Heart attack Father   . Diabetes Mother   . Migraines Mother     Review of Systems  All other systems reviewed and are negative.   Exam:   BP 128/80 (BP Location: Right Arm, Patient Position: Sitting, Cuff Size: Large)   Pulse 66   Resp 16   Ht 5' 5.5" (1.664 m)   Wt 192 lb 12.8 oz (87.5 kg)   LMP 12/27/2018 (Exact Date)   BMI 31.60 kg/m     General appearance: alert, cooperative and appears stated age Head: Normocephalic, without obvious abnormality, atraumatic Neck: no adenopathy, supple, symmetrical, trachea midline and thyroid normal to inspection and palpation Lungs: clear to auscultation bilaterally Breasts: normal appearance, no masses or tenderness, No nipple retraction or dimpling, No nipple discharge or bleeding, No axillary or supraclavicular adenopathy Heart: regular rate and rhythm Abdomen: soft, non-tender; no masses, no organomegaly Extremities: extremities normal, atraumatic, no cyanosis or edema Skin: Skin color, texture, turgor normal.  No rashes or lesions Lymph nodes: Cervical, supraclavicular, and axillary nodes normal. No abnormal inguinal nodes palpated Neurologic: Grossly normal  Pelvic: External genitalia:  no lesions              Urethra:  normal appearing urethra with no masses, tenderness or lesions              Bartholins and Skenes: normal                 Vagina: normal appearing vagina with normal color and discharge, no lesions              Cervix: no lesions.  Bleeds with pap.              Pap taken: Yes.   Bimanual Exam:  Uterus:   normal size, contour, position, consistency, mobility, non-tender              Adnexa: no mass, fullness, tenderness              Rectal exam: Yes.  .  Confirms.              Anus:  normal sphincter tone, no lesions  Chaperone was present for exam.  Assessment:   Well woman visit with normal exam. 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  Status post BTL.  Status post endometrial ablation.  History of HSV 1. Hx benign fasiculations.   Plan: Mammogram screening. Recommended self breast awareness. Pap and HR HPV as above. Guidelines for Calcium, Vitamin D, regular exercise program including cardiovascular and weight bearing exercise. STD screening.  Follow up annually and prn.   After visit summary provided.

## 2018-12-29 LAB — HEP, RPR, HIV PANEL
HEP B S AG: NEGATIVE
HIV Screen 4th Generation wRfx: NONREACTIVE
RPR Ser Ql: NONREACTIVE

## 2018-12-29 LAB — HEPATITIS C ANTIBODY

## 2018-12-30 LAB — CHLAMYDIA/GONOCOCCUS/TRICHOMONAS, NAA
Chlamydia by NAA: NEGATIVE
Gonococcus by NAA: NEGATIVE
TRICH VAG BY NAA: NEGATIVE

## 2018-12-30 LAB — CYTOLOGY - PAP
Diagnosis: NEGATIVE
HPV: NOT DETECTED

## 2019-02-20 ENCOUNTER — Encounter: Payer: Self-pay | Admitting: Obstetrics and Gynecology

## 2019-02-20 ENCOUNTER — Other Ambulatory Visit: Payer: Self-pay

## 2019-02-20 ENCOUNTER — Ambulatory Visit: Payer: BLUE CROSS/BLUE SHIELD | Admitting: Obstetrics and Gynecology

## 2019-02-20 VITALS — BP 116/70 | HR 72 | Temp 98.2°F | Resp 14 | Ht 65.5 in | Wt 196.0 lb

## 2019-02-20 DIAGNOSIS — N760 Acute vaginitis: Secondary | ICD-10-CM | POA: Diagnosis not present

## 2019-02-20 MED ORDER — BETAMETHASONE VALERATE 0.1 % EX OINT: 1.0000 "application " | TOPICAL_OINTMENT | Freq: Two times a day (BID) | CUTANEOUS | 0 refills | Status: DC

## 2019-02-20 NOTE — Patient Instructions (Signed)
Vaginitis  Vaginitis is a condition in which the vaginal tissue swells and becomes red (inflamed). This condition is most often caused by a change in the normal balance of bacteria and yeast that live in the vagina. This change causes an overgrowth of certain bacteria or yeast, which causes the inflammation. There are different types of vaginitis, but the most common types are:   Bacterial vaginosis.   Yeast infection (candidiasis).   Trichomoniasis vaginitis. This is a sexually transmitted disease (STD).   Viral vaginitis.   Atrophic vaginitis.   Allergic vaginitis.  What are the causes?  The cause of this condition depends on the type of vaginitis. It can be caused by:   Bacteria (bacterial vaginosis).   Yeast, which is a fungus (yeast infection).   A parasite (trichomoniasis vaginitis).   A virus (viral vaginitis).   Low hormone levels (atrophic vaginitis). Low hormone levels can occur during pregnancy, breastfeeding, or after menopause.   Irritants, such as bubble baths, scented tampons, and feminine sprays (allergic vaginitis).  Other factors can change the normal balance of the yeast and bacteria that live in the vagina. These include:   Antibiotic medicines.   Poor hygiene.   Diaphragms, vaginal sponges, spermicides, birth control pills, and intrauterine devices (IUD).   Sex.   Infection.   Uncontrolled diabetes.   A weakened defense (immune) system.  What increases the risk?  This condition is more likely to develop in women who:   Smoke.   Use vaginal douches, scented tampons, or scented sanitary pads.   Wear tight-fitting pants.   Wear thong underwear.   Use oral birth control pills or an IUD.   Have sex without a condom.   Have multiple sex partners.   Have an STD.   Frequently use the spermicide nonoxynol-9.   Eat lots of foods high in sugar.   Have uncontrolled diabetes.   Have low estrogen levels.   Have a weakened immune system from an immune disorder or medical  treatment.   Are pregnant or breastfeeding.  What are the signs or symptoms?  Symptoms vary depending on the cause of the vaginitis. Common symptoms include:   Abnormal vaginal discharge.  ? The discharge is white, gray, or yellow with bacterial vaginosis.  ? The discharge is thick, white, and cheesy with a yeast infection.  ? The discharge is frothy and yellow or greenish with trichomoniasis.   A bad vaginal smell. The smell is fishy with bacterial vaginosis.   Vaginal itching, pain, or swelling.   Sex that is painful.   Pain or burning when urinating.  Sometimes there are no symptoms.  How is this diagnosed?  This condition is diagnosed based on your symptoms and medical history. A physical exam, including a pelvic exam, will also be done. You may also have other tests, including:   Tests to determine the pH level (acidity or alkalinity) of your vagina.   A whiff test, to assess the odor that results when a sample of your vaginal discharge is mixed with a potassium hydroxide solution.   Tests of vaginal fluid. A sample will be examined under a microscope.  How is this treated?  Treatment varies depending on the type of vaginitis you have. Your treatment may include:   Antibiotic creams or pills to treat bacterial vaginosis and trichomoniasis.   Antifungal medicines, such as vaginal creams or suppositories, to treat a yeast infection.   Medicine to ease discomfort if you have viral vaginitis. Your sexual partner   should also be treated.   Estrogen delivered in a cream, pill, suppository, or vaginal ring to treat atrophic vaginitis. If vaginal dryness occurs, lubricants and moisturizing creams may help. You may need to avoid scented soaps, sprays, or douches.   Stopping use of a product that is causing allergic vaginitis. Then using a vaginal cream to treat the symptoms.  Follow these instructions at home:  Lifestyle   Keep your genital area clean and dry. Avoid soap, and only rinse the area with  water.   Do not douche or use tampons until your health care provider says it is okay to do so. Use sanitary pads, if needed.   Do not have sex until your health care provider approves. When you can return to sex, practice safe sex and use condoms.   Wipe from front to back. This avoids the spread of bacteria from the rectum to the vagina.  General instructions   Take over-the-counter and prescription medicines only as told by your health care provider.   If you were prescribed an antibiotic medicine, take or use it as told by your health care provider. Do not stop taking or using the antibiotic even if you start to feel better.   Keep all follow-up visits as told by your health care provider. This is important.  How is this prevented?   Use mild, non-scented products. Do not use things that can irritate the vagina, such as fabric softeners. Avoid the following products if they are scented:  ? Feminine sprays.  ? Detergents.  ? Tampons.  ? Feminine hygiene products.  ? Soaps or bubble baths.   Let air reach your genital area.  ? Wear cotton underwear to reduce moisture buildup.  ? Avoid wearing underwear while you sleep.  ? Avoid wearing tight pants and underwear or nylons without a cotton panel.  ? Avoid wearing thong underwear.   Take off any wet clothing, such as bathing suits, as soon as possible.   Practice safe sex and use condoms.  Contact a health care provider if:   You have abdominal pain.   You have a fever.   You have symptoms that last for more than 2-3 days.  Get help right away if:   You have a fever and your symptoms suddenly get worse.  Summary   Vaginitis is a condition in which the vaginal tissue becomes inflamed.This condition is most often caused by a change in the normal balance of bacteria and yeast that live in the vagina.   Treatment varies depending on the type of vaginitis you have.   Do not douche, use tampons , or have sex until your health care provider approves. When  you can return to sex, practice safe sex and use condoms.  This information is not intended to replace advice given to you by your health care provider. Make sure you discuss any questions you have with your health care provider.  Document Released: 08/16/2007 Document Revised: 11/24/2016 Document Reviewed: 11/24/2016  Elsevier Interactive Patient Education  2019 [employer]vier Inc[employer]

## 2019-02-20 NOTE — Progress Notes (Signed)
GYNECOLOGY  VISIT   HPI: 48 y.o.   Single Black or African American Not Hispanic or Latino  female   G2P2 with No LMP recorded. Patient has had an ablation.   here for possible yeast. Symptoms of itching and small amount of discharge x1.5 days. The itching is getting intense. The d/c was a small amount, brown.  Negative std testing in 2/20, same long term partner x 7 years.   GYNECOLOGIC HISTORY: No LMP recorded. Patient has had an ablation. Contraception:Tubal ligation and ablation Menopausal hormone therapy: none        OB History    Gravida  2   Para  2   Term      Preterm      AB      Living  2     SAB      TAB      Ectopic      Multiple      Live Births                 Patient Active Problem List   Diagnosis Date Noted  . Hemorrhoids 12/14/2016  . Rectal bleeding   . Folliculitis 03/24/2013  . S/P endometrial ablation 07/20/2012  . Cervical dysplasia 07/20/2012    Past Medical History:  Diagnosis Date  . Acne   . Acne   . Benign cellular changes seen on Pap smear 04/2010  . Condyloma 09/2007  . Cyst of right Bartholin's gland 2015  . IBS (irritable bowel syndrome)   . LGSIL (low grade squamous intraepithelial dysplasia)    04/2007, 04/2009,     Past Surgical History:  Procedure Laterality Date  . ENDOMETRIAL ABLATION  2005  . FLEXIBLE SIGMOIDOSCOPY  03/15/2008  . thrombosed hemorrhoid  12/2016  . TUBAL LIGATION  1999    Current Outpatient Medications  Medication Sig Dispense Refill  . fluocinonide (LIDEX) 0.05 % external solution   0  . ketoconazole (NIZORAL) 2 % shampoo ketoconazole 2 % shampoo  apply as directed twice a day 2-3 TIMES A WEEK    . Cholecalciferol (VITAMIN D3) 5000 UNITS CAPS Take by mouth.    . clobetasol ointment (TEMOVATE) 0.05 % clobetasol 0.05 % topical ointment     No current facility-administered medications for this visit.      ALLERGIES: Patient has no known allergies.  Family History  Problem Relation  Age of Onset  . Diabetes Brother 92       died with complications from diabetes  . Hypertension Father   . Heart disease Father   . Heart attack Father   . Diabetes Mother   . Migraines Mother     Social History   Socioeconomic History  . Marital status: Single    Spouse name: Not on file  . Number of children: 2  . Years of education: Master's  . Highest education level: Not on file  Occupational History  . Occupation: [employer]  Social Needs  . Financial resource strain: Not on file  . Food insecurity:    Worry: Not on file    Inability: Not on file  . Transportation needs:    Medical: Not on file    Non-medical: Not on file  Tobacco Use  . Smoking status: Never Smoker  . Smokeless tobacco: Never Used  Substance and Sexual Activity  . Alcohol use: No    Alcohol/week: 0.0 standard drinks  . Drug use: No  . Sexual activity: Yes    Partners: Male  Birth control/protection: Surgical    Comment: TUBAL LIGATION  Lifestyle  . Physical activity:    Days per week: Not on file    Minutes per session: Not on file  . Stress: Not on file  Relationships  . Social connections:    Talks on phone: Not on file    Gets together: Not on file    Attends religious service: Not on file    Active member of club or organization: Not on file    Attends meetings of clubs or organizations: Not on file    Relationship status: Not on file  . Intimate partner violence:    Fear of current or ex partner: Not on file    Emotionally abused: Not on file    Physically abused: Not on file    Forced sexual activity: Not on file  Other Topics Concern  . Not on file  Social History Narrative   Lives alone   Right-handed   Caffeine: sometimes 1 cup per day    Review of Systems  Constitutional: Negative.   HENT: Negative.   Eyes: Negative.   Respiratory: Negative.   Cardiovascular: Negative.   Gastrointestinal: Negative.   Genitourinary:       Vaginal itching  discharge   Musculoskeletal: Negative.   Skin: Negative.   Neurological: Negative.   Endo/Heme/Allergies: Negative.   Psychiatric/Behavioral: Negative.     PHYSICAL EXAMINATION:    BP 116/70 (BP Location: Right Arm, Patient Position: Sitting, Cuff Size: Large)   Pulse 72   Temp 98.2 F (36.8 C) (Oral)   Resp 14   Ht 5' 5.5" (1.664 m)   Wt 196 lb (88.9 kg)   BMI 32.12 kg/m     General appearance: alert, cooperative and appears stated age  Pelvic: External genitalia:  no lesions, mild erythema              Urethra:  normal appearing urethra with no masses, tenderness or lesions              Bartholins and Skenes: normal                 Vagina: normal appearing vagina with normal color and discharge, no lesions              Cervix: no lesions               Chaperone was present for exam.  Wet prep: no clue, no trich, + wbc KOH: no yeast PH: 4   ASSESSMENT Vulvovaginitis, negative vaginal slides    PLAN Affirm sent Steroid ointment for pruritis Further treatment depending on affirm results Discussed vulvar skin care, handout given   An After Visit Summary was printed and given to the patient.

## 2019-02-21 LAB — VAGINITIS/VAGINOSIS, DNA PROBE
Candida Species: NEGATIVE
Gardnerella vaginalis: NEGATIVE
Trichomonas vaginosis: NEGATIVE

## 2019-10-24 ENCOUNTER — Other Ambulatory Visit: Payer: Self-pay | Admitting: Obstetrics and Gynecology

## 2019-10-24 DIAGNOSIS — Z1231 Encounter for screening mammogram for malignant neoplasm of breast: Secondary | ICD-10-CM

## 2019-11-25 DIAGNOSIS — N760 Acute vaginitis: Secondary | ICD-10-CM | POA: Insufficient documentation

## 2019-11-25 DIAGNOSIS — J029 Acute pharyngitis, unspecified: Secondary | ICD-10-CM | POA: Insufficient documentation

## 2019-11-30 ENCOUNTER — Telehealth: Payer: Self-pay | Admitting: Obstetrics and Gynecology

## 2019-11-30 NOTE — Telephone Encounter (Signed)
Left message on voicemail to call and reschedule cancelled appointment. °

## 2019-12-13 ENCOUNTER — Ambulatory Visit
Admission: RE | Admit: 2019-12-13 | Discharge: 2019-12-13 | Disposition: A | Discharge status: Home / Self Care | Payer: BC Managed Care – PPO | Source: Ambulatory Visit | Attending: Obstetrics and Gynecology | Admitting: Obstetrics and Gynecology

## 2019-12-13 ENCOUNTER — Other Ambulatory Visit: Payer: Self-pay

## 2019-12-13 DIAGNOSIS — Z1231 Encounter for screening mammogram for malignant neoplasm of breast: Secondary | ICD-10-CM

## 2019-12-19 ENCOUNTER — Ambulatory Visit: Payer: BLUE CROSS/BLUE SHIELD | Attending: Internal Medicine

## 2019-12-19 ENCOUNTER — Other Ambulatory Visit: Payer: Self-pay

## 2019-12-19 DIAGNOSIS — Z20822 Contact with and (suspected) exposure to covid-19: Secondary | ICD-10-CM

## 2019-12-20 LAB — NOVEL CORONAVIRUS, NAA: SARS-CoV-2, NAA: DETECTED — AB

## 2019-12-23 ENCOUNTER — Emergency Department (HOSPITAL_COMMUNITY): Payer: BC Managed Care – PPO

## 2019-12-23 ENCOUNTER — Emergency Department (HOSPITAL_COMMUNITY)
Admission: EM | Admit: 2019-12-23 | Discharge: 2019-12-23 | Disposition: A | Discharge status: Home / Self Care | Payer: BC Managed Care – PPO | Attending: Emergency Medicine | Admitting: Emergency Medicine

## 2019-12-23 ENCOUNTER — Encounter (HOSPITAL_COMMUNITY): Payer: Self-pay

## 2019-12-23 ENCOUNTER — Other Ambulatory Visit: Payer: Self-pay

## 2019-12-23 DIAGNOSIS — U071 COVID-19: Secondary | ICD-10-CM | POA: Diagnosis not present

## 2019-12-23 DIAGNOSIS — R05 Cough: Secondary | ICD-10-CM | POA: Diagnosis present

## 2019-12-23 NOTE — ED Triage Notes (Signed)
Pt tested positive for covid on 12/19/19. Pt reports low grade fever, chills, congestion, cough, loss of taste/smell, and sob.

## 2019-12-23 NOTE — ED Provider Notes (Signed)
[facility] EMERGENCY DEPARTMENT Provider Note   CSN: [id number] Arrival date & time: 12/23/19  0957     History Chief Complaint  Patient presents with  . Covid Exposure    covid +    Annette Larsen is a 49 y.o. female.  Pt reports she tested positive for covid 4 days ago.  Pt has been sick for 6 days.  Pt reports she is coughing and was told she should come get her oxygen level checked. Pt has ahad fever. No tylenol today  The history is provided by the patient. No language interpreter was used.       Past Medical History:  Diagnosis Date  . Acne   . Acne   . Benign cellular changes seen on Pap smear 04/2010  . Condyloma 09/2007  . Cyst of right Bartholin's gland 2015  . IBS (irritable bowel syndrome)   . LGSIL (low grade squamous intraepithelial dysplasia)    04/2007, 04/2009,     Patient Active Problem List   Diagnosis Date Noted  . Hemorrhoids 12/14/2016  . Rectal bleeding   . Folliculitis 03/24/2013  . S/P endometrial ablation 07/20/2012  . Cervical dysplasia 07/20/2012    Past Surgical History:  Procedure Laterality Date  . ENDOMETRIAL ABLATION  2005  . FLEXIBLE SIGMOIDOSCOPY  03/15/2008  . thrombosed hemorrhoid  12/2016  . TUBAL LIGATION  1999     OB History    Gravida  2   Para  2   Term      Preterm      AB      Living  2     SAB      TAB      Ectopic      Multiple      Live Births              Family History  Problem Relation Age of Onset  . Diabetes Brother 32       died with complications from diabetes  . Hypertension Father   . Heart disease Father   . Heart attack Father   . Diabetes Mother   . Migraines Mother     Social History   Tobacco Use  . Smoking status: Never Smoker  . Smokeless tobacco: Never Used  Substance Use Topics  . Alcohol use: No    Alcohol/week: 0.0 standard drinks  . Drug use: No    Home Medications Prior to Admission medications   Medication Sig Start Date End Date Taking?  Authorizing Provider  betamethasone valerate ointment (VALISONE) 0.1 % Apply 1 application topically 2 (two) times daily. Can use for up to 2 weeks as needed 02/20/19   Romualdo Bolk, MD  Cholecalciferol (VITAMIN D3) 5000 UNITS CAPS Take by mouth.    [provider]  clobetasol ointment (TEMOVATE) 0.05 % clobetasol 0.05 % topical ointment    [provider]  fluocinonide (LIDEX) 0.05 % external solution  09/06/14   [provider]  ketoconazole (NIZORAL) 2 % shampoo ketoconazole 2 % shampoo  apply as directed twice a day 2-3 TIMES A WEEK    [provider]    Allergies    Patient has no known allergies.  Review of Systems   Review of Systems  All other systems reviewed and are negative.   Physical Exam Updated Vital Signs BP 133/69   Pulse 84   Temp (!) 100.4 F (38 C) (Oral)   Resp 20   Wt 90 kg  SpO2 100%   BMI 32.52 kg/m   Physical Exam Vitals and nursing note reviewed.  Constitutional:      Appearance: She is well-developed.  HENT:     Head: Normocephalic.     Nose: Nose normal.     Mouth/Throat:     Mouth: Mucous membranes are moist.  Pulmonary:     Effort: Pulmonary effort is normal.  Abdominal:     General: Abdomen is flat. There is no distension.  Musculoskeletal:        General: Normal range of motion.     Cervical back: Normal range of motion.  Skin:    General: Skin is warm.  Neurological:     Mental Status: She is alert and oriented to person, place, and time.  Psychiatric:        Mood and Affect: Mood normal.     ED Results / Procedures / Treatments   Labs (all labs ordered are listed, but only abnormal results are displayed) Labs Reviewed - No data to display  EKG None  Radiology DG [facility] 1 View  Result Date: 12/23/2019 CLINICAL DATA:  49 year old female with history of cough. Tested positive for COVID-19 on 12/19/2019. EXAM: PORTABLE CHEST 1 VIEW COMPARISON:  No priors. FINDINGS: Lung  volumes are normal. No consolidative airspace disease. No pleural effusions. No pneumothorax. No pulmonary nodule or mass noted. Pulmonary vasculature and the cardiomediastinal silhouette are within normal limits. IMPRESSION: No radiographic evidence of acute cardiopulmonary disease. Electronically Signed   By: Vinnie Langton M.D.   On: 12/23/2019 11:33    Procedures Procedures (including critical care time)  Medications Ordered in ED Medications - No data to display  ED Course  I have reviewed the triage vital signs and the nursing notes.  Pertinent labs & imaging results that were available during my care of the patient were reviewed by me and considered in my medical decision making (see chart for details).    MDM Rules/Calculators/A&P                      MDM:  Pulse ox 100%  Chest xray no pneumonia.  Pt advised symptomatic care return if any problems.  Final Clinical Impression(s) / ED Diagnoses Final diagnoses:  [id number]    Rx / DC Orders ED Discharge Orders    None    An After Visit Summary was printed and given to the patient.    Sidney Ace 12/23/19 1555    Milton Ferguson, MD 12/24/19 [phone]

## 2019-12-23 NOTE — ED Notes (Signed)
Pt discharge   Education after pt reports "I can't believe you're not sending me home with medicine"   Bacterial infection often require antibiotic intervention, viral illness does not  It is treated symptomatically   Important to treat with OTC meds for fever, discomfiture  Isolate  Hydrate Return is worse

## 2019-12-23 NOTE — Discharge Instructions (Signed)
Tylenol every 4 hours.  Continue quarantine.  Return if any problems.

## 2020-01-05 ENCOUNTER — Ambulatory Visit: Payer: BLUE CROSS/BLUE SHIELD | Admitting: Obstetrics and Gynecology

## 2020-01-16 ENCOUNTER — Other Ambulatory Visit: Payer: Self-pay

## 2020-01-16 ENCOUNTER — Ambulatory Visit: Payer: BLUE CROSS/BLUE SHIELD | Attending: Internal Medicine

## 2020-01-16 DIAGNOSIS — Z20822 Contact with and (suspected) exposure to covid-19: Secondary | ICD-10-CM

## 2020-01-17 ENCOUNTER — Other Ambulatory Visit: Payer: BLUE CROSS/BLUE SHIELD

## 2020-01-17 LAB — NOVEL CORONAVIRUS, NAA: SARS-CoV-2, NAA: NOT DETECTED

## 2020-02-27 NOTE — Progress Notes (Signed)
49 y.o. G2P2 Single African American female here for annual exam.    Having joint aches prior to Covid and continuing after.   Patient tested Pos.for COVID 12-19-19. States her whole family basically had Covid.   Some night sweats, which is manageable.   She has spotting with her menstruation, and this occurs monthly.  Lasts for 2 -3 days.   She really wants a pap and STD screening today.  She will see her new PCP in May.   PCP:   Dr. Melford Aase  Patient's last menstrual period was 01/28/2020 (approximate).           Sexually active: Yes.    The current method of family planning is tubal ligation.    Exercising: Yes.    walking Smoker:  No  Health Maintenance: Pap: 12-28-18 Neg:Neg HR HPV, 08-27-17 Neg:Neg HR HPV, 08-20-16 Neg:Neg HR HPV History of abnormal Pap:  Yes, 2008, 2011 LSIL:no treatment to cervix, only repeat paps which reverted to normal.  MMG: 12-15-19 3D/Neg/density B/BiRads1 Colonoscopy: 2018 normal;next 2028 BMD:   n/a  Result  n/a TDaP:  08-12-11 Gardasil:   no HIV: 12-28-18 NR Hep C: 12-28-18 Neg Screening Labs:  Hb today: PCP, Urine today: not collected   reports that she has never smoked. She has never used smokeless tobacco. She reports that she does not drink alcohol or use drugs.  Past Medical History:  Diagnosis Date  . Acne   . Acne   . Benign cellular changes seen on Pap smear 04/2010  . Condyloma 09/2007  . Cyst of right Bartholin's gland 2015  . IBS (irritable bowel syndrome)   . LGSIL (low grade squamous intraepithelial dysplasia)    04/2007, 04/2009,     Past Surgical History:  Procedure Laterality Date  . ENDOMETRIAL ABLATION  2005  . FLEXIBLE SIGMOIDOSCOPY  03/15/2008  . thrombosed hemorrhoid  12/2016  . TUBAL LIGATION  1999    Current Outpatient Medications  Medication Sig Dispense Refill  . Cholecalciferol (VITAMIN D3) 5000 UNITS CAPS Take by mouth.    . clobetasol ointment (TEMOVATE) 0.05 % Apply to scalp 3-4x per week. Never to  face or groin.    . Fluocinolone Acetonide Body 0.01 % OIL Apply 4-5 drops to affected areas on ears daily as needed.    . fluocinonide (LIDEX) 0.05 % external solution   0  . hydrocortisone 2.5 % cream APPLY TOPICALLY TO FACE DAILY AS NEEDED    . ketoconazole (NIZORAL) 2 % shampoo ketoconazole 2 % shampoo  apply as directed twice a day 2-3 TIMES A WEEK     No current facility-administered medications for this visit.    Family History  Problem Relation Age of Onset  . Diabetes Brother 81       died with complications from diabetes  . Hypertension Father   . Heart disease Father   . Heart attack Father   . Diabetes Mother   . Migraines Mother     Review of Systems  Constitutional: Negative.   HENT: Negative.   Eyes: Negative.   Respiratory: Negative.   Cardiovascular: Negative.   Gastrointestinal: Negative.   Endocrine: Negative.   Genitourinary: Negative.   Musculoskeletal: Positive for joint swelling.  Skin: Negative.   Allergic/Immunologic: Negative.   Neurological: Negative.   Hematological: Negative.   Psychiatric/Behavioral: Negative.     Exam:   BP 130/76 (BP Location: Right Arm, Patient Position: Sitting, Cuff Size: Normal)   Pulse 78   Temp 98 F (36.7 C) (  Skin)   Resp 14   Ht 5' 6.75" (1.695 m)   Wt 200 lb (90.7 kg)   LMP 01/28/2020 (Approximate)   BMI 31.56 kg/m     General appearance: alert, cooperative and appears stated age Head: normocephalic, without obvious abnormality, atraumatic Neck: no adenopathy, supple, symmetrical, trachea midline and thyroid normal to inspection and palpation Lungs: clear to auscultation bilaterally Breasts: normal appearance, no masses or tenderness, No nipple retraction or dimpling, No nipple discharge or bleeding, No axillary adenopathy Heart: regular rate and rhythm Abdomen: soft, non-tender; no masses, no organomegaly Extremities: extremities normal, atraumatic, no cyanosis or edema Skin: skin color, texture,  turgor normal. No rashes or lesions Lymph nodes: cervical, supraclavicular, and axillary nodes normal. Neurologic: grossly normal  Pelvic: External genitalia:  no lesions              No abnormal inguinal nodes palpated.              Urethra:  normal appearing urethra with no masses, tenderness or lesions              Bartholins and Skenes: normal                 Vagina: normal appearing vagina with normal color and discharge, no lesions              Cervix: no lesions              Pap taken: Yes.   Bimanual Exam:  Uterus:  normal size, contour, position, consistency, mobility, non-tender              Adnexa: no mass, fullness, tenderness              Rectal exam: Yes.  .  Confirms.              Anus:  normal sphincter tone, no lesions  Chaperone was present for exam.  Assessment:   Well woman visit with normal exam. 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  Status post BTL.  Status post endometrial ablation.  History of HSV 1. Hx benign fasiculations.   Plan: Mammogram screening discussed. Self breast awareness reviewed. Pap and HR HPV as above.  We discussed the new guidelines.  Guidelines for Calcium, Vitamin D, regular exercise program including cardiovascular and weight bearing exercise. STD screening.  Routine labs with PCP.  Follow up annually and prn.   After visit summary provided.

## 2020-02-28 ENCOUNTER — Encounter: Payer: Self-pay | Admitting: Obstetrics and Gynecology

## 2020-02-28 ENCOUNTER — Ambulatory Visit (INDEPENDENT_AMBULATORY_CARE_PROVIDER_SITE_OTHER): Payer: BC Managed Care – PPO | Admitting: Obstetrics and Gynecology

## 2020-02-28 ENCOUNTER — Other Ambulatory Visit (HOSPITAL_COMMUNITY)
Admission: RE | Admit: 2020-02-28 | Discharge: 2020-02-28 | Disposition: A | Discharge status: Home / Self Care | Payer: BC Managed Care – PPO | Source: Ambulatory Visit | Attending: Obstetrics and Gynecology | Admitting: Obstetrics and Gynecology

## 2020-02-28 ENCOUNTER — Other Ambulatory Visit: Payer: Self-pay

## 2020-02-28 VITALS — BP 130/76 | HR 78 | Temp 98.0°F | Resp 14 | Ht 66.75 in | Wt 200.0 lb

## 2020-02-28 DIAGNOSIS — Z113 Encounter for screening for infections with a predominantly sexual mode of transmission: Secondary | ICD-10-CM

## 2020-02-28 DIAGNOSIS — Z01419 Encounter for gynecological examination (general) (routine) without abnormal findings: Secondary | ICD-10-CM

## 2020-02-28 NOTE — Patient Instructions (Signed)

## 2020-02-29 LAB — CYTOLOGY - PAP
Chlamydia: NEGATIVE
Comment: NEGATIVE
Comment: NEGATIVE
Comment: NEGATIVE
Comment: NORMAL
Diagnosis: NEGATIVE
High risk HPV: NEGATIVE
Neisseria Gonorrhea: NEGATIVE
Trichomonas: NEGATIVE

## 2020-02-29 LAB — HEP, RPR, HIV PANEL
HIV Screen 4th Generation wRfx: NONREACTIVE
Hepatitis B Surface Ag: NEGATIVE
RPR Ser Ql: NONREACTIVE

## 2020-02-29 LAB — HEPATITIS C ANTIBODY: Hep C Virus Ab: <0.1 s/co ratio (ref 0.0–0.9)

## 2020-10-07 ENCOUNTER — Emergency Department (HOSPITAL_COMMUNITY)
Admission: EM | Admit: 2020-10-07 | Discharge: 2020-10-08 | Disposition: A | Discharge status: Home / Self Care | Payer: BC Managed Care – PPO | Attending: Emergency Medicine | Admitting: Emergency Medicine

## 2020-10-07 ENCOUNTER — Emergency Department (HOSPITAL_COMMUNITY): Payer: BC Managed Care – PPO

## 2020-10-07 DIAGNOSIS — R519 Headache, unspecified: Secondary | ICD-10-CM | POA: Insufficient documentation

## 2020-10-07 DIAGNOSIS — R202 Paresthesia of skin: Secondary | ICD-10-CM | POA: Diagnosis not present

## 2020-10-07 DIAGNOSIS — R200 Anesthesia of skin: Secondary | ICD-10-CM

## 2020-10-07 LAB — CBC
HCT: 44 % (ref 36.0–46.0)
Hemoglobin: 15.3 g/dL — ABNORMAL HIGH (ref 12.0–15.0)
MCH: 31.1 pg (ref 26.0–34.0)
MCHC: 34.8 g/dL (ref 30.0–36.0)
MCV: 89.4 fL (ref 80.0–100.0)
Platelets: 286 10*3/uL (ref 150–400)
RBC: 4.92 MIL/uL (ref 3.87–5.11)
RDW: 13.3 % (ref 11.5–15.5)
WBC: 6.7 10*3/uL (ref 4.0–10.5)
nRBC: 0 % (ref 0.0–0.2)

## 2020-10-07 LAB — I-STAT CHEM 8, ED
BUN: 8 mg/dL (ref 6–20)
Calcium, Ion: 1.21 mmol/L (ref 1.15–1.40)
Chloride: 106 mmol/L (ref 98–111)
Creatinine, Ser: 0.8 mg/dL (ref 0.44–1.00)
Glucose, Bld: 113 mg/dL — ABNORMAL HIGH (ref 70–99)
HCT: 44 % (ref 36.0–46.0)
Hemoglobin: 15 g/dL (ref 12.0–15.0)
Potassium: 3.7 mmol/L (ref 3.5–5.1)
Sodium: 140 mmol/L (ref 135–145)
TCO2: 24 mmol/L (ref 22–32)

## 2020-10-07 LAB — COMPREHENSIVE METABOLIC PANEL
ALT: 19 U/L (ref 0–44)
AST: 18 U/L (ref 15–41)
Albumin: 3.8 g/dL (ref 3.5–5.0)
Alkaline Phosphatase: 38 U/L (ref 38–126)
Anion gap: 9 (ref 5–15)
BUN: 5 mg/dL — ABNORMAL LOW (ref 6–20)
CO2: 23 mmol/L (ref 22–32)
Calcium: 9.3 mg/dL (ref 8.9–10.3)
Chloride: 105 mmol/L (ref 98–111)
Creatinine, Ser: 0.96 mg/dL (ref 0.44–1.00)
GFR, Estimated: >60 mL/min (ref >60)
Glucose, Bld: 116 mg/dL — ABNORMAL HIGH (ref 70–99)
Potassium: 3.7 mmol/L (ref 3.5–5.1)
Sodium: 137 mmol/L (ref 135–145)
Total Bilirubin: 0.6 mg/dL (ref 0.3–1.2)
Total Protein: 7.2 g/dL (ref 6.5–8.1)

## 2020-10-07 LAB — DIFFERENTIAL
Abs Immature Granulocytes: 0.02 10*3/uL (ref 0.00–0.07)
Basophils Absolute: 0.1 10*3/uL (ref 0.0–0.1)
Basophils Relative: 1 %
Eosinophils Absolute: 0.1 10*3/uL (ref 0.0–0.5)
Eosinophils Relative: 2 %
Immature Granulocytes: 0 %
Lymphocytes Relative: 30 %
Lymphs Abs: 2 10*3/uL (ref 0.7–4.0)
Monocytes Absolute: 0.5 10*3/uL (ref 0.1–1.0)
Monocytes Relative: 7 %
Neutro Abs: 4 10*3/uL (ref 1.7–7.7)
Neutrophils Relative %: 60 %

## 2020-10-07 LAB — PROTIME-INR
INR: 1 (ref 0.8–1.2)
Prothrombin Time: 13.1 seconds (ref 11.4–15.2)

## 2020-10-07 LAB — APTT: aPTT: 31 seconds (ref 24–36)

## 2020-10-07 MED ORDER — LIDOCAINE 5 % EX PTCH
1.0000 | MEDICATED_PATCH | Freq: Once | CUTANEOUS | Status: DC
  Administered 2020-10-07: 1 via TRANSDERMAL
  Filled 2020-10-07: qty 1

## 2020-10-07 MED ORDER — METHOCARBAMOL 500 MG PO TABS
500.0000 mg | ORAL_TABLET | Freq: Once | ORAL | Status: AC
  Administered 2020-10-07: 500 mg via ORAL
  Filled 2020-10-07: qty 1

## 2020-10-07 MED ORDER — LORAZEPAM 1 MG PO TABS
1.0000 mg | ORAL_TABLET | Freq: Once | ORAL | Status: DC | PRN
  Filled 2020-10-07: qty 1

## 2020-10-07 MED ORDER — LORAZEPAM 2 MG/ML IJ SOLN
1.0000 mg | Freq: Once | INTRAMUSCULAR | Status: AC
  Administered 2020-10-07: 1 mg via INTRAVENOUS
  Filled 2020-10-07: qty 1

## 2020-10-07 NOTE — Discharge Instructions (Addendum)
Please made an appointment with Dr. Wynetta Emery for further evaluation and management of cervical disc disease.

## 2020-10-07 NOTE — ED Provider Notes (Signed)
Pending MRI head, c-spine for evaluation of numbness and tingling in her right arm. This is a longstanding problem. New sxs worsening headache, 'weakness'.   Patient initially seen by Jodi Geralds, PAC, and Dr. Effie Shy, signed out at end of shift to review MRI studies and determine disposition.   She has seen Dr. Lucia Gaskins, neuro, and [place] [facility] in past. Last MRI of record 2018.   Review MRI. Anticipate d/ch home ?refer to neurosurgery  MRI shows disc protrusion of C5-6, similar to MR in 2018. No cord compromise. MRI brain negative.   Recheck of the patient finds her resting well. VSS. He IV sedative medication for the MRI was 3 hours ago. She feels sleeping but is not somnolent. Will keep her until 4 hours post medications and discharge home. Will refer to neurosurgery.      Annette Anis, PA-C 10/08/20 0050    Mancel Bale, MD 10/08/20 1504

## 2020-10-07 NOTE — ED Provider Notes (Signed)
[facility] EMERGENCY DEPARTMENT Provider Note   CSN: [id number] Arrival date & time: 10/07/20  1056     History Chief Complaint  Patient presents with  . Numbness    Right arm numbness and tingling    Annette Larsen is a 49 y.o. female.  Annette Larsen is a 49 y.o. female with a history of IBS, and cervical disc disease, who presents to the emergency department for evaluation of numbness and tingling in the right arm.  She states that for the last 2-1/2 years she has had difficulties with sensory changes in her right arm.  She had MRIs done as an outpatient in 2018, but does not know what they showed.  She is continued to have sensory problems in her arm, has numbness and tingling that is frequently worse after a night of sleep.  She denies associated pain in her arm or neck, does have some pain in her shoulder blade often at night when she is sleeping.  This pain has been present for over a year.  She reports that 3 days ago she developed difficulty with motor function in her hand.  She reports when trying to do fine movements she has had problems.  She tried to reach for applesauce but ended up putting her hand in the applesauce, and today she was having difficulty typing which is something she usually has no issues with.  She denies any numbness, weakness or tingling in her left arm or lower extremities.  No facial droop or change in sensation over the face.  No visual changes or speech changes.  Does report a mild headache today.  No other aggravating or alleviating factors.  No prior history of stroke.        Past Medical History:  Diagnosis Date  . Acne   . Acne   . Benign cellular changes seen on Pap smear 04/2010  . Condyloma 09/2007  . Cyst of right Bartholin's gland 2015  . IBS (irritable bowel syndrome)   . LGSIL (low grade squamous intraepithelial dysplasia)    04/2007, 04/2009,     Patient Active Problem List   Diagnosis Date Noted  .  Hemorrhoids 12/14/2016  . Rectal bleeding   . Folliculitis 03/24/2013  . S/P endometrial ablation 07/20/2012  . Cervical dysplasia 07/20/2012    Past Surgical History:  Procedure Laterality Date  . ENDOMETRIAL ABLATION  2005  . FLEXIBLE SIGMOIDOSCOPY  03/15/2008  . thrombosed hemorrhoid  12/2016  . TUBAL LIGATION  1999     OB History    Gravida  2   Para  2   Term      Preterm      AB      Living  2     SAB      TAB      Ectopic      Multiple      Live Births              Family History  Problem Relation Age of Onset  . Diabetes Brother 88       died with complications from diabetes  . Hypertension Father   . Heart disease Father   . Heart attack Father   . Diabetes Mother   . Migraines Mother     Social History   Tobacco Use  . Smoking status: Never Smoker  . Smokeless tobacco: Never Used  Vaping Use  . Vaping Use: Never used  Substance Use Topics  .  Alcohol use: No    Alcohol/week: 0.0 standard drinks  . Drug use: No    Home Medications Prior to Admission medications   Medication Sig Start Date End Date Taking? Authorizing Provider  Cholecalciferol (VITAMIN D3) 5000 UNITS CAPS Take by mouth.    [provider]  clobetasol ointment (TEMOVATE) 0.05 % Apply to scalp 3-4x per week. Never to face or groin. 02/14/20   [provider]  Fluocinolone Acetonide Body 0.01 % OIL Apply 4-5 drops to affected areas on ears daily as needed. 02/14/20   [provider]  fluocinonide (LIDEX) 0.05 % external solution  09/06/14   [provider]  hydrocortisone 2.5 % cream APPLY TOPICALLY TO FACE DAILY AS NEEDED 02/15/20   [provider]  ketoconazole (NIZORAL) 2 % shampoo ketoconazole 2 % shampoo  apply as directed twice a day 2-3 TIMES A WEEK    [provider]    Allergies    Patient has no known allergies.  Review of Systems   Review of Systems  Constitutional: Negative for chills and fever.    HENT: Negative.   Eyes: Negative for visual disturbance.  Respiratory: Negative for shortness of breath.   Cardiovascular: Negative for chest pain.  Gastrointestinal: Negative for abdominal pain, nausea and vomiting.  Musculoskeletal: Positive for back pain. Negative for arthralgias and myalgias.  Skin: Negative for color change and rash.  Neurological: Positive for weakness, numbness and headaches. Negative for dizziness, tremors, seizures, syncope, facial asymmetry, speech difficulty and light-headedness.  All other systems reviewed and are negative.   Physical Exam Updated Vital Signs BP (!) 145/82   Pulse 71   Temp 98.2 F (36.8 C) (Oral)   Resp 13   SpO2 100%   Physical Exam Vitals and nursing note reviewed.  Constitutional:      General: She is not in acute distress.    Appearance: Normal appearance. She is well-developed. She is not ill-appearing or diaphoretic.  HENT:     Head: Normocephalic and atraumatic.  Eyes:     General:        Right eye: No discharge.        Left eye: No discharge.     Extraocular Movements: Extraocular movements intact.     Pupils: Pupils are equal, round, and reactive to light.  Neck:     Comments: No midline C-spine tenderness, normal range of motion, no recreation of symptoms with Spurling maneuver bilaterally Cardiovascular:     Rate and Rhythm: Normal rate and regular rhythm.     Pulses: Normal pulses.     Heart sounds: Normal heart sounds. No murmur heard.  No friction rub. No gallop.   Pulmonary:     Effort: Pulmonary effort is normal. No respiratory distress.     Breath sounds: Normal breath sounds. No wheezing or rales.     Comments: Respirations equal and unlabored, patient able to speak in full sentences, lungs clear to auscultation bilaterally Abdominal:     General: Bowel sounds are normal. There is no distension.     Palpations: Abdomen is soft. There is no mass.     Tenderness: There is no abdominal tenderness. There is  no guarding.     Comments: Abdomen soft, nondistended, nontender to palpation in all quadrants without guarding or peritoneal signs   Musculoskeletal:        General: No deformity.     Cervical back: Neck supple.     Comments: No midline thoracic or lumbar spine tenderness. Patient  does have palpable spasm over the right trapezius muscle.  No overlying skin changes. Right upper extremity with normal range of motion at all joints, 2+ distal pulses  Skin:    General: Skin is warm and dry.     Capillary Refill: Capillary refill takes less than 2 seconds.  Neurological:     Mental Status: She is alert.     Coordination: Coordination normal.     Comments: Speech is clear, able to follow commands CN III-XII intact Normal strength in upper and lower extremities bilaterally including dorsiflexion and plantar flexion, strong and equal grip strength Patient reports decreased sensation in the right arm compared to left Moves extremities without ataxia, coordination intact Normal finger-to-nose and rapid alternating movements, no pronator drift Ambulatory without difficulty   Psychiatric:        Behavior: Behavior normal.     ED Results / Procedures / Treatments   Labs (all labs ordered are listed, but only abnormal results are displayed) Labs Reviewed  CBC - Abnormal; Notable for the following components:      Result Value   Hemoglobin 15.3 (*)    All other components within normal limits  COMPREHENSIVE METABOLIC PANEL - Abnormal; Notable for the following components:   Glucose, Bld 116 (*)    BUN 5 (*)    All other components within normal limits  I-STAT CHEM 8, ED - Abnormal; Notable for the following components:   Glucose, Bld 113 (*)    All other components within normal limits  PROTIME-INR  APTT  DIFFERENTIAL  ETHANOL  RAPID URINE DRUG SCREEN, HOSP PERFORMED  URINALYSIS, ROUTINE W REFLEX MICROSCOPIC    EKG None  Radiology CT HEAD WO CONTRAST  Result Date:  10/07/2020 CLINICAL DATA:  Acute neuro deficit. Right arm tingling. Severe headache. EXAM: CT HEAD WITHOUT CONTRAST TECHNIQUE: Contiguous axial images were obtained from the base of the skull through the vertex without intravenous contrast. COMPARISON:  None. FINDINGS: Brain: No evidence of acute infarction, hemorrhage, hydrocephalus, extra-axial collection or mass lesion/mass effect. Vascular: Negative for hyperdense vessel Skull: No focal skeletal abnormality Sinuses/Orbits: Paranasal sinuses clear.  Negative orbit. Other: None IMPRESSION: Normal CT head Electronically Signed   By: Marlan Palau M.D.   On: 10/07/2020 12:10    Procedures Procedures (including critical care time)  Medications Ordered in ED Medications  lidocaine (LIDODERM) 5 % 1 patch (1 patch Transdermal Patch Applied 10/07/20 2217)  methocarbamol (ROBAXIN) tablet 500 mg (500 mg Oral Given 10/07/20 2206)  LORazepam (ATIVAN) injection 1 mg (1 mg Intravenous Given 10/07/20 2213)    ED Course  I have reviewed the triage vital signs and the nursing notes.  Pertinent labs & imaging results that were available during my care of the patient were reviewed by me and considered in my medical decision making (see chart for details).    MDM Rules/Calculators/A&P                         49 year old female presents with persistent numbness and tingling in the right arm now with 3 days of fine motor difficulty in the right hand.  Symptoms initially started about 2-1/2 years ago with numbness and tingling, had MRIs done, which I was able to review in patient's medical chart, they showed a mild right cervical disc bulge at C5-C6 as well as some central disc osteophyte complexes at C4-5 and C6-7.  MRI of the brain was unremarkable at this time.  She now has  worsening sensory symptoms as well as issues with fine motor skills of the right hand, difficulty typing today and reaching for and grabbing things.  No other focal neurologic deficits noted on  exam.  Labs and CT of the head were initiated from triage.  I have reviewed lab work, no significant electrolyte derangements, no leukocytosis.  CT of the head is unremarkable.  We will get MRI of the head to rule out stroke as cause for new difficulty with fine motor skills in the right hand, but I have higher suspicion that this may be due to worsening cervical disc disease we will also get MRI of the C-spine.  Patient with pain and spasm over the trapezius muscle, not improved with NSAIDs at home, has not followed up about these in about 6 months, will try Robaxin and Lidoderm patch.  At shift change care signed out to Dr. Effie Shy who will follow up on MRIs and disposition appropriately.   Final Clinical Impression(s) / ED Diagnoses Final diagnoses:  Numbness and tingling of right arm    Rx / DC Orders ED Discharge Orders    None       Legrand Rams 10/07/20 2304    Mancel Bale, MD 10/08/20 [phone]

## 2020-10-07 NOTE — ED Notes (Signed)
Pt. Stated, Im having more tingling and numbness on my right arm.

## 2020-10-07 NOTE — ED Triage Notes (Signed)
Pt here from home with c/o right arm numbness and  Tingling , that has been ongoing since sat ,

## 2020-10-07 NOTE — ED Provider Notes (Signed)
  Face-to-face evaluation   History: She presents for evaluation of headache, clumsiness and tingling of the right arm and hand.  Onset of symptoms several days ago.  She also has ongoing difficulty with numbness in her entire right arm and a numb sensation of her right shoulder blade.  She has previously been seen by [facility] orthopedics for the cervical problem and been referred to physical therapy.  She did not want to proceed with that modality.  She has also seen a neurologist, Dr. Daisy Blossom in the past.  Physical exam: Alert obese middle-aged female who is lucid.  No dysarthria or aphasia.  Normal gross strength arms and legs, bilaterally.  She is able to snap the fingers of the right hand using thumb and middle finger.  Medical screening examination/treatment/procedure(s) were conducted as a shared visit with non-physician practitioner(s) and myself.  I personally evaluated the patient during the encounter    Mancel Bale, MD 10/08/20 [phone]

## 2020-10-08 LAB — RAPID URINE DRUG SCREEN, HOSP PERFORMED
Amphetamines: NOT DETECTED
Barbiturates: NOT DETECTED
Benzodiazepines: NOT DETECTED
Cocaine: NOT DETECTED
Opiates: NOT DETECTED
Tetrahydrocannabinol: NOT DETECTED

## 2020-10-08 LAB — URINALYSIS, ROUTINE W REFLEX MICROSCOPIC
Bilirubin Urine: NEGATIVE
Glucose, UA: NEGATIVE mg/dL
Ketones, ur: NEGATIVE mg/dL
Leukocytes,Ua: NEGATIVE
Nitrite: NEGATIVE
Protein, ur: NEGATIVE mg/dL
Specific Gravity, Urine: 1.009 (ref 1.005–1.030)
pH: 6 (ref 5.0–8.0)

## 2021-01-30 ENCOUNTER — Emergency Department (HOSPITAL_COMMUNITY)
Admission: EM | Admit: 2021-01-30 | Discharge: 2021-01-30 | Disposition: A | Discharge status: Home / Self Care | Payer: BC Managed Care – PPO | Attending: Emergency Medicine | Admitting: Emergency Medicine

## 2021-01-30 ENCOUNTER — Emergency Department (HOSPITAL_COMMUNITY): Payer: BC Managed Care – PPO

## 2021-01-30 ENCOUNTER — Other Ambulatory Visit: Payer: Self-pay

## 2021-01-30 ENCOUNTER — Encounter (HOSPITAL_COMMUNITY): Payer: Self-pay | Admitting: Emergency Medicine

## 2021-01-30 DIAGNOSIS — K219 Gastro-esophageal reflux disease without esophagitis: Secondary | ICD-10-CM | POA: Diagnosis not present

## 2021-01-30 DIAGNOSIS — R197 Diarrhea, unspecified: Secondary | ICD-10-CM | POA: Insufficient documentation

## 2021-01-30 DIAGNOSIS — R0602 Shortness of breath: Secondary | ICD-10-CM | POA: Diagnosis not present

## 2021-01-30 DIAGNOSIS — R1011 Right upper quadrant pain: Secondary | ICD-10-CM | POA: Diagnosis present

## 2021-01-30 DIAGNOSIS — R350 Frequency of micturition: Secondary | ICD-10-CM | POA: Insufficient documentation

## 2021-01-30 LAB — CBC WITH DIFFERENTIAL/PLATELET
Abs Immature Granulocytes: 0.02 10*3/uL (ref 0.00–0.07)
Basophils Absolute: 0.1 10*3/uL (ref 0.0–0.1)
Basophils Relative: 1 %
Eosinophils Absolute: 0.2 10*3/uL (ref 0.0–0.5)
Eosinophils Relative: 2 %
HCT: 44.5 % (ref 36.0–46.0)
Hemoglobin: 15 g/dL (ref 12.0–15.0)
Immature Granulocytes: 0 %
Lymphocytes Relative: 25 %
Lymphs Abs: 1.8 10*3/uL (ref 0.7–4.0)
MCH: 30.7 pg (ref 26.0–34.0)
MCHC: 33.7 g/dL (ref 30.0–36.0)
MCV: 91.2 fL (ref 80.0–100.0)
Monocytes Absolute: 0.4 10*3/uL (ref 0.1–1.0)
Monocytes Relative: 6 %
Neutro Abs: 5 10*3/uL (ref 1.7–7.7)
Neutrophils Relative %: 66 %
Platelets: 264 10*3/uL (ref 150–400)
RBC: 4.88 MIL/uL (ref 3.87–5.11)
RDW: 13.2 % (ref 11.5–15.5)
WBC: 7.5 10*3/uL (ref 4.0–10.5)
nRBC: 0 % (ref 0.0–0.2)

## 2021-01-30 LAB — LIPASE, BLOOD: Lipase: 27 U/L (ref 11–51)

## 2021-01-30 LAB — URINALYSIS, ROUTINE W REFLEX MICROSCOPIC
Bilirubin Urine: NEGATIVE
Glucose, UA: NEGATIVE mg/dL
Ketones, ur: NEGATIVE mg/dL
Leukocytes,Ua: NEGATIVE
Nitrite: NEGATIVE
Protein, ur: NEGATIVE mg/dL
Specific Gravity, Urine: 1.003 — ABNORMAL LOW (ref 1.005–1.030)
pH: 6 (ref 5.0–8.0)

## 2021-01-30 LAB — COMPREHENSIVE METABOLIC PANEL
ALT: 61 U/L — ABNORMAL HIGH (ref 0–44)
AST: 39 U/L (ref 15–41)
Albumin: 3.7 g/dL (ref 3.5–5.0)
Alkaline Phosphatase: 38 U/L (ref 38–126)
Anion gap: 10 (ref 5–15)
BUN: 9 mg/dL (ref 6–20)
CO2: 24 mmol/L (ref 22–32)
Calcium: 9.2 mg/dL (ref 8.9–10.3)
Chloride: 102 mmol/L (ref 98–111)
Creatinine, Ser: 0.87 mg/dL (ref 0.44–1.00)
GFR, Estimated: >60 mL/min (ref >60)
Glucose, Bld: 154 mg/dL — ABNORMAL HIGH (ref 70–99)
Potassium: 3.5 mmol/L (ref 3.5–5.1)
Sodium: 136 mmol/L (ref 135–145)
Total Bilirubin: 0.7 mg/dL (ref 0.3–1.2)
Total Protein: 7.4 g/dL (ref 6.5–8.1)

## 2021-01-30 LAB — PREGNANCY, URINE: Preg Test, Ur: NEGATIVE

## 2021-01-30 LAB — TROPONIN I (HIGH SENSITIVITY): Troponin I (High Sensitivity): <2 ng/L (ref <18)

## 2021-01-30 MED ORDER — OMEPRAZOLE 20 MG PO CPDR: 20.0000 mg | DELAYED_RELEASE_CAPSULE | Freq: Every day | ORAL | 0 refills | Status: DC

## 2021-01-30 MED ORDER — CEPHALEXIN 500 MG PO CAPS: 500.0000 mg | ORAL_CAPSULE | Freq: Two times a day (BID) | ORAL | 0 refills | Status: AC

## 2021-01-30 NOTE — ED Provider Notes (Signed)
[facility] EMERGENCY DEPARTMENT Provider Note   CSN: [id number] Arrival date & time: 01/30/21  1237     History Chief Complaint  Patient presents with  . Back Pain    Annette Larsen is a 50 y.o. female who presents with 3 days of intermittent episodes of right upper quadrant/right lower rib pain.  She describes the pain is sharp in nature lasting less than 30 seconds at a time, with fewer than 5 episodes daily.  She has had associated nausea, and 2 episodes of very loose diarrhea this morning.  She denies any melena or hematochezia, denies vomiting but endorses sensation of heartburn today.  She also endorses shortness of breath for the last 3 days, both with activity and when at rest with speaking.  She denies any chest pain or palpitations, denies any fevers or chills at home. Pain radiates straight through to her back.   She is vaccinated against COVID-19 and denies any recent sick contacts.  I personally reviewed the patient's medical records.  She has history of IBS fluctuating between constipation and loose stools, cervical dysplasia, and rectal bleeding secondary to hemorrhoids.  She is status post endometrial ablation.  HPI     Past Medical History:  Diagnosis Date  . Acne   . Acne   . Benign cellular changes seen on Pap smear 04/2010  . Condyloma 09/2007  . Cyst of right Bartholin's gland 2015  . IBS (irritable bowel syndrome)   . LGSIL (low grade squamous intraepithelial dysplasia)    04/2007, 04/2009,     Patient Active Problem List   Diagnosis Date Noted  . Hemorrhoids 12/14/2016  . Rectal bleeding   . Folliculitis 03/24/2013  . S/P endometrial ablation 07/20/2012  . Cervical dysplasia 07/20/2012    Past Surgical History:  Procedure Laterality Date  . ENDOMETRIAL ABLATION  2005  . FLEXIBLE SIGMOIDOSCOPY  03/15/2008  . thrombosed hemorrhoid  12/2016  . TUBAL LIGATION  1999     OB History    Gravida  2   Para  2   Term      Preterm      AB       Living  2     SAB      IAB      Ectopic      Multiple      Live Births              Family History  Problem Relation Age of Onset  . Diabetes Brother [id number]       died with complications from diabetes  . Hypertension Father   . Heart disease Father   . Heart attack Father   . Diabetes Mother   . Migraines Mother     Social History   Tobacco Use  . Smoking status: Never Smoker  . Smokeless tobacco: Never Used  Vaping Use  . Vaping Use: Never used  Substance Use Topics  . Alcohol use: No    Alcohol/week: 0.0 standard drinks  . Drug use: No    Home Medications Prior to Admission medications   Medication Sig Start Date End Date Taking? Authorizing Provider  cephALEXin (KEFLEX) 500 MG capsule Take 1 capsule (500 mg total) by mouth 2 (two) times daily for 5 days. 01/30/21 02/04/21 Yes Treavon Castilleja, Eugene Gaviaebekah R, PA-C  omeprazole (PRILOSEC) 20 MG capsule Take 1 capsule (20 mg total) by mouth daily. 01/30/21 03/01/21 Yes Marifer Hurd, Eugene Gaviaebekah R, PA-C  Cholecalciferol (VITAMIN D3) 5000 UNITS CAPS Take by  mouth.    [provider]  clobetasol ointment (TEMOVATE) 0.05 % Apply to scalp 3-4x per week. Never to face or groin. 02/14/20   [provider]  Fluocinolone Acetonide Body 0.01 % OIL Apply 4-5 drops to affected areas on ears daily as needed. 02/14/20   [provider]  fluocinonide (LIDEX) 0.05 % external solution  09/06/14   [provider]  hydrocortisone 2.5 % cream APPLY TOPICALLY TO FACE DAILY AS NEEDED 02/15/20   [provider]  ketoconazole (NIZORAL) 2 % shampoo ketoconazole 2 % shampoo  apply as directed twice a day 2-3 TIMES A WEEK    [provider]    Allergies    Patient has no known allergies.  Review of Systems   Review of Systems  Constitutional: Positive for appetite change. Negative for activity change, chills, diaphoresis, fatigue and fever.  HENT: Negative.   Eyes: Negative.   Respiratory: Positive for  shortness of breath. Negative for cough, chest tightness and wheezing.   Cardiovascular: Negative for chest pain, palpitations and leg swelling.  Gastrointestinal: Positive for abdominal pain, diarrhea and nausea. Negative for constipation and vomiting.  Genitourinary: Positive for frequency and urgency. Negative for decreased urine volume, dysuria, hematuria, vaginal bleeding, vaginal discharge and vaginal pain.  Musculoskeletal: Positive for back pain.  Skin: Negative.   Neurological: Negative.   Hematological: Negative.     Physical Exam Updated Vital Signs BP (!) 134/97   Pulse 85   Temp 97.9 F (36.6 C) (Oral)   Resp (!) 24   Ht 5\' 6"  (1.676 m)   Wt 90.7 kg   SpO2 99%   BMI 32.28 kg/m   Physical Exam Vitals and nursing note reviewed.  Constitutional:      Appearance: She is not ill-appearing or toxic-appearing.  HENT:     Head: Normocephalic and atraumatic.     Nose: Nose normal.     Mouth/Throat:     Mouth: Mucous membranes are moist.     Pharynx: Oropharynx is clear. Uvula midline. No oropharyngeal exudate or posterior oropharyngeal erythema.     Tonsils: No tonsillar exudate.  Eyes:     General: Lids are normal. Vision grossly intact.        Right eye: No discharge.        Left eye: No discharge.     Extraocular Movements: Extraocular movements intact.     Conjunctiva/sclera: Conjunctivae normal.     Pupils: Pupils are equal, round, and reactive to light.  Neck:     Trachea: Trachea and phonation normal.  Cardiovascular:     Rate and Rhythm: Normal rate and regular rhythm.     Pulses: Normal pulses.          Radial pulses are 2+ on the right side and 2+ on the left side.       Dorsalis pedis pulses are 2+ on the right side and 2+ on the left side.     Heart sounds: Normal heart sounds. No murmur heard.   Pulmonary:     Effort: Pulmonary effort is normal. No respiratory distress.     Breath sounds: Normal breath sounds. No wheezing or rales.  Chest:      Chest wall: No mass, lacerations, deformity, swelling, tenderness, crepitus or edema.  Abdominal:     General: Bowel sounds are normal. There is no distension.     Palpations: Abdomen is soft.     Tenderness: There is abdominal tenderness in the right upper quadrant and  suprapubic area. There is no right CVA tenderness, left CVA tenderness, guarding or rebound. Positive signs include Murphy's sign. Negative signs include Rovsing's sign and McBurney's sign.  Musculoskeletal:        General: No deformity.     Cervical back: Full passive range of motion without pain, normal range of motion and neck supple. No edema, rigidity, tenderness or crepitus. No pain with movement or spinous process tenderness.     Right lower leg: No edema.     Left lower leg: No edema.  Lymphadenopathy:     Cervical: No cervical adenopathy.  Skin:    General: Skin is warm and dry.     Capillary Refill: Capillary refill takes less than 2 seconds.  Neurological:     General: No focal deficit present.     Mental Status: She is alert and oriented to person, place, and time. Mental status is at baseline.     Sensory: Sensation is intact.     Motor: Motor function is intact.     Gait: Gait is intact.  Psychiatric:        Mood and Affect: Mood normal.     ED Results / Procedures / Treatments   Labs (all labs ordered are listed, but only abnormal results are displayed) Labs Reviewed  COMPREHENSIVE METABOLIC PANEL - Abnormal; Notable for the following components:      Result Value   Glucose, Bld 154 (*)    ALT 61 (*)    All other components within normal limits  URINALYSIS, ROUTINE W REFLEX MICROSCOPIC - Abnormal; Notable for the following components:   Color, Urine STRAW (*)    Specific Gravity, Urine 1.003 (*)    Hgb urine dipstick SMALL (*)    Bacteria, UA FEW (*)    All other components within normal limits  LIPASE, BLOOD  CBC WITH DIFFERENTIAL/PLATELET  PREGNANCY, URINE  TROPONIN I (HIGH SENSITIVITY)     EKG EKG: normal sinus rhythm, no STEMI.   Radiology DG Chest 2 View  Result Date: 01/30/2021 CLINICAL DATA:  Right upper quadrant sharp pain extending to the ribs and upper back which is worse with inspiration EXAM: CHEST - 2 VIEW COMPARISON:  12/23/2019 FINDINGS: The heart size and mediastinal contours are within normal limits. Both lungs are clear. The visualized skeletal structures are unremarkable. IMPRESSION: No active cardiopulmonary disease. Electronically Signed   By: Acquanetta Belling M.D.   On: 01/30/2021 14:15   US Abdomen Limited RUQ (LIVER/GB)  Result Date: 01/30/2021 CLINICAL DATA:  Right upper quadrant pain EXAM: ULTRASOUND ABDOMEN LIMITED RIGHT UPPER QUADRANT COMPARISON:  Abdominal ultrasound January 02, 2015 FINDINGS: Gallbladder: No gallstones or wall thickening visualized. There is no pericholecystic fluid. No sonographic Murphy sign noted by sonographer. Common bile duct: Diameter: 5 mm. No intrahepatic or extrahepatic biliary duct dilatation. Liver: Liver echogenicity is overall increased. A comparatively hypoechoic area near the gallbladder fossa measuring 4.0 x 2.2 x 3.0 cm is noted, potentially representing localized fatty sparing. Portal vein is patent on color Doppler imaging with normal direction of blood flow towards the liver. Other: None. IMPRESSION: Overall increase in liver echogenicity consistent with hepatic steatosis. Suspect fatty sparing near the gallbladder fossa. It may be prudent to consider a follow-up ultrasound in approximately 6 months to assess for stability of this area. Study otherwise unremarkable. Electronically Signed   By: Bretta Bang III M.D.   On: 01/30/2021 15:16    Procedures Procedures   Medications Ordered in ED Medications - No data to  display  ED Course  I have reviewed the triage vital signs and the nursing notes.  Pertinent labs & imaging results that were available during my care of the patient were reviewed by me and considered  in my medical decision making (see chart for details).    MDM Rules/Calculators/A&P                         50 year old female presents with concern of 3 days of intermittent episodes of sharp right upper quadrant pain with associated nausea and 2 loose stools this morning.  She denies any fevers or chills at home.  No history of abdominal surgeries.  The differential diagnosis for RUQ includes but is not limited to:  Cholelithiasis / choledocholithiasis / cholecystitis / cholangitis, hepatitis (eg. viral, alcoholic, toxic),liver abscess, pancreatitis, liver / pancreatic / biliary tract cancer, ischemic hepatopathy (shock liver), hepatic vein obstruction (Budd-Chiari syndrome), liver cell adenoma, peptic ulcer disease (duodenal), functional or nonulcer dyspepsia, right lower lobe pneumonia, pyelonephritis, urinary calculi, Fitz-Hugh-Curtis syndrome (with pelvic inflammatory disease), herpes zoster, trauma or musculoskeletal pain, herniated disk, abdominal abscess, intestinal ischemia, physical or sexual abuse, ectopic pregnancy, IUP, Mittelschmerz, ovarian cyst/torsion, threatened/ievitable abortion, PID, endometriosis, molar pregnancy, heterotopic pregnancy, corpus luteum cyst, appendicitis, UTI/renal colic, IBD.   Hypertensive on intake, vital signs otherwise normal.  Cardiopulmonary exam is normal, abdominal exam revealed right upper quadrant tenderness to palpation with Murphy sign, without rebound tenderness or guarding.  Mild suprapubic tenderness to palpation as well.  CBC is unremarkable, CMP with very mild elevation in ALT to 61, however appears benign.  UA with small hemoglobin and few bacteria.  Troponin is normal, lipase is normal. Chest x-ray negative for acute cardiopulmonary disease, right upper quadrant ultrasound revealed hepatic steatosis with fatty sparing near the gallbladder fossa with recommendation for follow-up ultrasound in 6 months.  No evidence of acute cholecystitis,  cholelithiasis, and no Murphy sign for sonographer.  While exact etiology of the patient's symptoms remains unclear, there does not appear to be any emergent source.  Suspect biliary colic as source of right upper quadrant pain, will provide dietary modification education and have patient follow-up with her primary care doctor and have repeat RUQ [place] in 6 months.  Additionally suspect GERD component for epigastric pain and pressure, will discharge with omeprazole Rx. As patient is having urinary symptoms with few bacteria on UA, will discharge with course of antibiotic therapy for suspected UTI.  Nicolas voiced understanding of her medical evaluation and treatment plan.  Each of her questions was answered to her expressed satisfaction.  Return precautions given.  Patient is well-appearing, stable, and appropriate for discharge at this time.  This chart was dictated using voice recognition software, Dragon. Despite the best efforts of this provider to proofread and correct errors, errors may still occur which can change documentation meaning.   Final Clinical Impression(s) / ED Diagnoses Final diagnoses:  RUQ pain  Urinary frequency  Gastroesophageal reflux disease without esophagitis    Rx / DC Orders ED Discharge Orders         Ordered    omeprazole (PRILOSEC) 20 MG capsule  Daily        01/30/21 1609    cephALEXin (KEFLEX) 500 MG capsule  2 times daily        01/30/21 [address] Eugene Gavia, PA-C 01/30/21 1626    Maia Plan, MD 01/31/21 [phone]

## 2021-01-30 NOTE — ED Triage Notes (Signed)
Pt reports sharp pain to right rib and right upper back worsens with inspiration and when lying down x 3 days

## 2021-01-30 NOTE — Discharge Instructions (Addendum)
You were seen in the emergency department for your right upper belly pain.  Your physical exam, vital signs, blood work, and ultrasound were very reassuring.  There does not appear to be any emergent cause for your pain. Your symptoms are likely a combination of gastroesophageal reflux disease (heartburn) as well as biliary colic, which is spasming of your gallbladder.  Please see the attached educational information for diet modifications you may make to avoid further flares of this in the future.   Additionally you have been prescribed medication called omeprazole to treat your acid reflux, and you have been prescribed an antibiotic called cephalexin to treat your urinary tract infection symptoms.  Please follow-up shortly with your primary care doctor.  Of note your right upper quadrant abdominal ultrasound revealed fatty deposition within your liver known as hepatic steatosis.  Recommend a repeat right upper quadrant ultrasound in 6 months for reevaluation.  Return to the ER if you develop worsening abdominal pain, nausea vomiting that does not stop, or any other new severe symptoms.

## 2021-03-03 ENCOUNTER — Ambulatory Visit: Payer: BC Managed Care – PPO | Admitting: Obstetrics and Gynecology

## 2021-03-03 DIAGNOSIS — Z0289 Encounter for other administrative examinations: Secondary | ICD-10-CM

## 2021-03-03 NOTE — Progress Notes (Deleted)
50 y.o. G2P2 Single [place] American female here for annual exam.    PCP:     No LMP recorded. Patient has had an ablation.           Sexually active: {yes no:314532}  The current method of family planning is tubal ligation.    Exercising: {yes no:314532}  {types:19826} Smoker:  no  Health Maintenance: Pap: 12-28-18 Neg:Neg HR HPV, 08-27-17 Neg:Neg HR HPV, 08-20-16 Neg:Neg HR HPV History of abnormal Pap:  Yes, 2008, 2011 LSIL:no treatment to cervix, only repeat paps which reverted to normal.  MMG: ***12-15-19 3D/Neg/density B/BiRads1 Colonoscopy: 2018 normal;next 2028 BMD:  n/a  Result  n/a TDaP:  08-12-11 Gardasil:   no HIV:12-28-18 NR Hep C:12-28-18 Neg Screening Labs:  Hb today: ***, Urine today: ***   reports that she has never smoked. She has never used smokeless tobacco. She reports that she does not drink alcohol and does not use drugs.  Past Medical History:  Diagnosis Date  . Acne   . Acne   . Benign cellular changes seen on Pap smear 04/2010  . Condyloma 09/2007  . Cyst of right Bartholin's gland 2015  . IBS (irritable bowel syndrome)   . LGSIL (low grade squamous intraepithelial dysplasia)    04/2007, 04/2009,     Past Surgical History:  Procedure Laterality Date  . ENDOMETRIAL ABLATION  2005  . FLEXIBLE SIGMOIDOSCOPY  03/15/2008  . thrombosed hemorrhoid  12/2016  . TUBAL LIGATION  1999    Current Outpatient Medications  Medication Sig Dispense Refill  . Cholecalciferol (VITAMIN D3) 5000 UNITS CAPS Take by mouth.    . clobetasol ointment (TEMOVATE) 0.05 % Apply to scalp 3-4x per week. Never to face or groin.    . Fluocinolone Acetonide Body 0.01 % OIL Apply 4-5 drops to affected areas on ears daily as needed.    . fluocinonide (LIDEX) 0.05 % external solution   0  . hydrocortisone 2.5 % cream APPLY TOPICALLY TO FACE DAILY AS NEEDED    . ketoconazole (NIZORAL) 2 % shampoo ketoconazole 2 % shampoo  apply as directed twice a day 2-3 TIMES A WEEK    .  omeprazole (PRILOSEC) 20 MG capsule Take 1 capsule (20 mg total) by mouth daily. 30 capsule 0   No current facility-administered medications for this visit.    Family History  Problem Relation Age of Onset  . Diabetes Brother 25       died with complications from diabetes  . Hypertension Father   . Heart disease Father   . Heart attack Father   . Diabetes Mother   . Migraines Mother     Review of Systems  Exam:   There were no vitals taken for this visit.    General appearance: alert, cooperative and appears stated age Head: normocephalic, without obvious abnormality, atraumatic Neck: no adenopathy, supple, symmetrical, trachea midline and thyroid normal to inspection and palpation Lungs: clear to auscultation bilaterally Breasts: normal appearance, no masses or tenderness, No nipple retraction or dimpling, No nipple discharge or bleeding, No axillary adenopathy Heart: regular rate and rhythm Abdomen: soft, non-tender; no masses, no organomegaly Extremities: extremities normal, atraumatic, no cyanosis or edema Skin: skin color, texture, turgor normal. No rashes or lesions Lymph nodes: cervical, supraclavicular, and axillary nodes normal. Neurologic: grossly normal  Pelvic: External genitalia:  no lesions              No abnormal inguinal nodes palpated.  Urethra:  normal appearing urethra with no masses, tenderness or lesions              Bartholins and Skenes: normal                 Vagina: normal appearing vagina with normal color and discharge, no lesions              Cervix: no lesions              Pap taken: {yes no:314532} Bimanual Exam:  Uterus:  normal size, contour, position, consistency, mobility, non-tender              Adnexa: no mass, fullness, tenderness              Rectal exam: {yes no:314532}.  Confirms.              Anus:  normal sphincter tone, no lesions  Chaperone was present for exam.  Assessment:   Well woman visit with normal  exam.   Plan: Mammogram screening discussed. Self breast awareness reviewed. Pap and HR HPV as above. Guidelines for Calcium, Vitamin D, regular exercise program including cardiovascular and weight bearing exercise.   Follow up annually and prn.   Additional counseling given.  {yes [id number]. _______ minutes face to face time of which over 50% was spent in counseling.    After visit summary provided.

## 2021-07-08 ENCOUNTER — Other Ambulatory Visit: Payer: Self-pay | Admitting: Obstetrics and Gynecology

## 2021-07-08 DIAGNOSIS — Z1231 Encounter for screening mammogram for malignant neoplasm of breast: Secondary | ICD-10-CM

## 2021-08-15 ENCOUNTER — Other Ambulatory Visit: Payer: Self-pay

## 2021-08-15 ENCOUNTER — Ambulatory Visit
Admission: RE | Admit: 2021-08-15 | Discharge: 2021-08-15 | Disposition: A | Discharge status: Home / Self Care | Payer: 59 | Source: Ambulatory Visit | Attending: Obstetrics and Gynecology | Admitting: Obstetrics and Gynecology

## 2021-08-15 DIAGNOSIS — Z1231 Encounter for screening mammogram for malignant neoplasm of breast: Secondary | ICD-10-CM

## 2021-11-02 HISTORY — PX: TOOTH EXTRACTION: SUR596

## 2021-11-14 NOTE — Progress Notes (Deleted)
51 y.o. G2P2 Single [place] female here for annual exam.    PCP:     No LMP recorded. Patient has had an ablation.           Sexually active: {yes no:314532}  The current method of family planning is tubal ligation.    Exercising: {yes no:314532}  {types:19826} Smoker:  no  Health Maintenance: Pap:  02-28-20 Neg:Neg HR HPV, 12-28-18 Neg:Neg HR HPV, 08-27-17 Neg:Neg HR HPV History of abnormal Pap:  Yes, 2008, 2011 LSIL:no treatment to cervix, only repeat paps which reverted to normal.   MMG:  10-14-22Neg/BiRads1 Colonoscopy:  n/a BMD:   n/a  Result  n/a TDaP:  ***2012 Gardasil:   no HIV: 02-28-20 NR Hep C: 02-28-20 Neg Screening Labs:  Hb today: ***, Urine today: ***   reports that she has never smoked. She has never used smokeless tobacco. She reports that she does not drink alcohol and does not use drugs.  Past Medical History:  Diagnosis Date   Acne    Acne    Benign cellular changes seen on Pap smear 04/2010   Condyloma 09/2007   Cyst of right Bartholin's gland 2015   IBS (irritable bowel syndrome)    LGSIL (low grade squamous intraepithelial dysplasia)    04/2007, 04/2009,     Past Surgical History:  Procedure Laterality Date   ENDOMETRIAL ABLATION  2005   FLEXIBLE SIGMOIDOSCOPY  03/15/2008   thrombosed hemorrhoid  12/2016   TUBAL LIGATION  1999    Current Outpatient Medications  Medication Sig Dispense Refill   Cholecalciferol (VITAMIN D3) 5000 UNITS CAPS Take by mouth.     clobetasol ointment (TEMOVATE) 0.05 % Apply to scalp 3-4x per week. Never to face or groin.     Fluocinolone Acetonide Body 0.01 % OIL Apply 4-5 drops to affected areas on ears daily as needed.     fluocinonide (LIDEX) 0.05 % external solution   0   hydrocortisone 2.5 % cream APPLY TOPICALLY TO FACE DAILY AS NEEDED     ketoconazole (NIZORAL) 2 % shampoo ketoconazole 2 % shampoo  apply as directed twice a day 2-3 TIMES A WEEK     omeprazole (PRILOSEC) 20 MG capsule Take 1 capsule (20  mg total) by mouth daily. 30 capsule 0   No current facility-administered medications for this visit.    Family History  Problem Relation Age of Onset   Diabetes Brother 84       died with complications from diabetes   Hypertension Father    Heart disease Father    Heart attack Father    Diabetes Mother    Migraines Mother     Review of Systems  Exam:   There were no vitals taken for this visit.    General appearance: alert, cooperative and appears stated age Head: normocephalic, without obvious abnormality, atraumatic Neck: no adenopathy, supple, symmetrical, trachea midline and thyroid normal to inspection and palpation Lungs: clear to auscultation bilaterally Breasts: normal appearance, no masses or tenderness, No nipple retraction or dimpling, No nipple discharge or bleeding, No axillary adenopathy Heart: regular rate and rhythm Abdomen: soft, non-tender; no masses, no organomegaly Extremities: extremities normal, atraumatic, no cyanosis or edema Skin: skin color, texture, turgor normal. No rashes or lesions Lymph nodes: cervical, supraclavicular, and axillary nodes normal. Neurologic: grossly normal  Pelvic: External genitalia:  no lesions              No abnormal inguinal nodes palpated.  Urethra:  normal appearing urethra with no masses, tenderness or lesions              Bartholins and Skenes: normal                 Vagina: normal appearing vagina with normal color and discharge, no lesions              Cervix: no lesions              Pap taken: {yes no:314532} Bimanual Exam:  Uterus:  normal size, contour, position, consistency, mobility, non-tender              Adnexa: no mass, fullness, tenderness              Rectal exam: {yes no:314532}.  Confirms.              Anus:  normal sphincter tone, no lesions  Chaperone was present for exam:  ***  Assessment:   Well woman visit with gynecologic exam.   Plan: Mammogram screening discussed. Self  breast awareness reviewed. Pap and HR HPV as above. Guidelines for Calcium, Vitamin D, regular exercise program including cardiovascular and weight bearing exercise.   Follow up annually and prn.   Additional counseling given.  {yes [id number]. _______ minutes face to face time of which over 50% was spent in counseling.    After visit summary provided.

## 2021-11-17 ENCOUNTER — Ambulatory Visit: Payer: Self-pay | Admitting: Obstetrics and Gynecology

## 2021-11-17 ENCOUNTER — Other Ambulatory Visit: Payer: Self-pay

## 2021-12-16 ENCOUNTER — Encounter: Payer: Self-pay | Admitting: Obstetrics and Gynecology

## 2021-12-16 ENCOUNTER — Other Ambulatory Visit: Payer: Self-pay

## 2021-12-16 ENCOUNTER — Ambulatory Visit (INDEPENDENT_AMBULATORY_CARE_PROVIDER_SITE_OTHER): Payer: 59 | Admitting: Obstetrics and Gynecology

## 2021-12-16 ENCOUNTER — Other Ambulatory Visit (HOSPITAL_COMMUNITY)
Admission: RE | Admit: 2021-12-16 | Discharge: 2021-12-16 | Disposition: A | Discharge status: Home / Self Care | Payer: Self-pay | Source: Ambulatory Visit | Attending: Obstetrics and Gynecology | Admitting: Obstetrics and Gynecology

## 2021-12-16 VITALS — BP 110/78 | HR 76 | Ht 65.0 in | Wt 201.0 lb

## 2021-12-16 DIAGNOSIS — Z01419 Encounter for gynecological examination (general) (routine) without abnormal findings: Secondary | ICD-10-CM

## 2021-12-16 DIAGNOSIS — Z124 Encounter for screening for malignant neoplasm of cervix: Secondary | ICD-10-CM | POA: Insufficient documentation

## 2021-12-16 DIAGNOSIS — Z113 Encounter for screening for infections with a predominantly sexual mode of transmission: Secondary | ICD-10-CM | POA: Diagnosis not present

## 2021-12-16 DIAGNOSIS — Z119 Encounter for screening for infectious and parasitic diseases, unspecified: Secondary | ICD-10-CM

## 2021-12-16 DIAGNOSIS — Z23 Encounter for immunization: Secondary | ICD-10-CM

## 2021-12-16 NOTE — Patient Instructions (Signed)

## 2021-12-16 NOTE — Progress Notes (Signed)
51 y.o. G2P2 Single [place] American female here for annual exam.    Patient would like STD testing. Just getting out of a relationship.  Really wants a pap today.   Menses are monthly.   Patient seeing her GI for diarrhea x 2 months. She is scheduled for colonoscopy. Had a stool culture positive for salmonella.   PCP:  None  No LMP recorded. Patient has had an ablation.  Only has occ. Spotting since ablation.  Period Cycle (Days):  (occ. spotting only since ablation)     Sexually active: No.  The current method of family planning is tubal ligation.    Exercising: No.   Just starting back with walking Smoker:  no  Health Maintenance: Pap:  02/28/20 - neg, neg HR HPV, 12-28-18 Neg:Neg HR HPV, 08-27-17 Neg:Neg HR HPV, 08-20-16 Neg:Neg HR HPV History of abnormal Pap:  Yes,   2008, 2011 LSIL:no treatment to cervix, only repeat paps which reverted to normal.   MMG:  08-15-21 Neg/BiRads1 Colonoscopy:  2018 normal;next 2028 BMD:   n/a  Result  n/a TDaP: 2012--Unsure.  Today.  Gardasil:   no HIV: 02-28-20 NR Hep C: 02-28-20 Neg Screening Labs:  PCP. Flu vaccine:  declined.  Covid:  1 booster.    reports that she has never smoked. She has never used smokeless tobacco. She reports that she does not drink alcohol and does not use drugs.  Past Medical History:  Diagnosis Date   Acne    Acne    Benign cellular changes seen on Pap smear 04/2010   Condyloma 09/2007   Cyst of right Bartholin's gland 2015   IBS (irritable bowel syndrome)    LGSIL (low grade squamous intraepithelial dysplasia)    04/2007, 04/2009,     Past Surgical History:  Procedure Laterality Date   ENDOMETRIAL ABLATION  2005   FLEXIBLE SIGMOIDOSCOPY  03/15/2008   thrombosed hemorrhoid  12/2016   TUBAL LIGATION  1999    Current Outpatient Medications  Medication Sig Dispense Refill   Cholecalciferol (VITAMIN D3) 5000 UNITS CAPS Take by mouth.     ciprofloxacin (CIPRO) 500 MG tablet 1 tablet     clobetasol  ointment (TEMOVATE) 0.05 % Apply topically.     Fluocinolone Acetonide Body 0.01 % OIL Apply 4-5 drops to affected areas on ears daily as needed.     fluocinonide (LIDEX) 0.05 % external solution   0   hydrocortisone 2.5 % cream APPLY TOPICALLY TO FACE DAILY AS NEEDED     ketoconazole (NIZORAL) 2 % shampoo ketoconazole 2 % shampoo  apply as directed twice a day 2-3 TIMES A WEEK     No current facility-administered medications for this visit.    Family History  Problem Relation Age of Onset   Diabetes Brother 61       died with complications from diabetes   Hypertension Father    Heart disease Father    Heart attack Father    Diabetes Mother    Migraines Mother     Review of Systems  All other systems reviewed and are negative.  Exam:   BP 110/78    Pulse 76    Ht 5\' 5"  (1.651 m)    Wt 201 lb (91.2 kg)    SpO2 97%    BMI 33.45 kg/m     General appearance: alert, cooperative and appears stated age Head: normocephalic, without obvious abnormality, atraumatic Neck: no adenopathy, supple, symmetrical, trachea midline and thyroid normal to inspection and palpation Lungs:  clear to auscultation bilaterally Breasts: normal appearance, no masses or tenderness, No nipple retraction or dimpling, No nipple discharge or bleeding, No axillary adenopathy Heart: regular rate and rhythm Abdomen: soft, non-tender; no masses, no organomegaly Extremities: extremities normal, atraumatic, no cyanosis or edema Skin: skin color, texture, turgor normal. No rashes or lesions Lymph nodes: cervical, supraclavicular, and axillary nodes normal. Neurologic: grossly normal  Pelvic: External genitalia:  no lesions              No abnormal inguinal nodes palpated.              Urethra:  normal appearing urethra with no masses, tenderness or lesions              Bartholins and Skenes: normal                 Vagina: normal appearing vagina with normal color and discharge, no lesions              Cervix: no  lesions.  Light menstrual bleeding.              Pap taken: yes Bimanual Exam:  Uterus:  normal size, contour, position, consistency, mobility, non-tender              Adnexa: no mass, fullness, tenderness              Rectal exam: yes.  Confirms.              Anus:  normal sphincter tone, no lesions  Chaperone was present for exam:  Marchelle Folks, CMA  Assessment:   Well woman visit with gynecologic exam. 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  Status post BTL.   Status post endometrial ablation.   History of HSV 1. STD and infectious disease screening.  Hx IBS.  Chronic diarrhea.  Salmonella positive test per patient.   Plan: Mammogram screening discussed. Self breast awareness reviewed. Pap and HR HPV collected.  Testing for GC/CT/trich/HIV, syphilis, and hep C.  She will do routine labs with PCP. Guidelines for Calcium, Vitamin D, regular exercise program including cardiovascular and weight bearing exercise. TDap.  Follow up annually and prn.   After visit summary provided.

## 2021-12-17 LAB — HIV ANTIBODY (ROUTINE TESTING W REFLEX): HIV 1&2 Ab, 4th Generation: NONREACTIVE

## 2021-12-17 LAB — HEPATITIS C ANTIBODY
Hepatitis C Ab: NONREACTIVE
SIGNAL TO CUT-OFF: <0.02 (ref <1.00)

## 2021-12-17 LAB — RPR: RPR Ser Ql: NONREACTIVE

## 2021-12-25 LAB — CYTOLOGY - PAP
Chlamydia: NEGATIVE
Comment: NEGATIVE
Comment: NEGATIVE
Comment: NEGATIVE
Comment: NORMAL
Diagnosis: NEGATIVE
Diagnosis: REACTIVE
High risk HPV: NEGATIVE
Neisseria Gonorrhea: NEGATIVE
Trichomonas: NEGATIVE

## 2022-04-14 ENCOUNTER — Encounter: Payer: Self-pay | Admitting: Obstetrics and Gynecology

## 2022-04-14 ENCOUNTER — Ambulatory Visit (INDEPENDENT_AMBULATORY_CARE_PROVIDER_SITE_OTHER): Payer: 59 | Admitting: Obstetrics and Gynecology

## 2022-04-14 VITALS — BP 120/76 | Ht 65.0 in | Wt 201.0 lb

## 2022-04-14 DIAGNOSIS — L739 Follicular disorder, unspecified: Secondary | ICD-10-CM | POA: Diagnosis not present

## 2022-04-14 NOTE — Progress Notes (Signed)
GYNECOLOGY  VISIT   HPI: 51 y.o.   Single  African American  female   G2P2 with No LMP recorded (approximate). Patient has had an ablation.   here for right vulvar lesion x 1-2 days.    Painful if she squeezes it.  Some liquid came out of it yesterday.   Has not had this before.   Not sexually active, but has concerns about what this area means to her.  Negative STD screening this year at her visit on 12/16/21.   Currently not taking Amoxicillin.  She has this for a dental infection and taking it as needed.   GYNECOLOGIC HISTORY: No LMP recorded (approximate). Patient has had an ablation. Contraception:  Tubal/Ablation Menopausal hormone therapy:  none Last mammogram:  08-15-21 Neg/BiRads1 Last pap smear:  12-16-21 Neg:Neg HR HPV, 4-28/21 Neg:Neg HR HPV,  12-28-18 Neg:Neg HR HPV        OB History     Gravida  2   Para  2   Term      Preterm      AB      Living  2      SAB      IAB      Ectopic      Multiple      Live Births                 Patient Active Problem List   Diagnosis Date Noted   Hemorrhoids 12/14/2016   Rectal bleeding    Folliculitis 03/24/2013   S/P endometrial ablation 07/20/2012   Cervical dysplasia 07/20/2012    Past Medical History:  Diagnosis Date   Acne    Acne    Benign cellular changes seen on Pap smear 04/2010   Condyloma 09/2007   Cyst of right Bartholin's gland 2015   IBS (irritable bowel syndrome)    LGSIL (low grade squamous intraepithelial dysplasia)    04/2007, 04/2009,     Past Surgical History:  Procedure Laterality Date   ENDOMETRIAL ABLATION  2005   FLEXIBLE SIGMOIDOSCOPY  03/15/2008   thrombosed hemorrhoid  12/2016   TUBAL LIGATION  1999    Current Outpatient Medications  Medication Sig Dispense Refill   amoxicillin (AMOXIL) 500 MG tablet Take 500 mg by mouth 3 (three) times daily.     betamethasone, augmented, (DIPROLENE) 0.05 % lotion Apply topically 2 (two) times daily.     Cholecalciferol  (VITAMIN D3) 5000 UNITS CAPS Take by mouth.     ciprofloxacin (CIPRO) 500 MG tablet 1 tablet     clobetasol ointment (TEMOVATE) 0.05 % Apply topically.     Fluocinolone Acetonide Body 0.01 % OIL Apply 4-5 drops to affected areas on ears daily as needed.     fluocinonide (LIDEX) 0.05 % external solution   0   hydrocortisone 2.5 % cream APPLY TOPICALLY TO FACE DAILY AS NEEDED     ketoconazole (NIZORAL) 2 % shampoo ketoconazole 2 % shampoo  apply as directed twice a day 2-3 TIMES A WEEK     No current facility-administered medications for this visit.     ALLERGIES: Cephalexin  Family History  Problem Relation Age of Onset   Diabetes Brother 10       died with complications from diabetes   Hypertension Father    Heart disease Father    Heart attack Father    Diabetes Mother    Migraines Mother     Social History   Socioeconomic History   Marital status:  Single    Spouse name: Not on file   Number of children: 2   Years of education: Master's   Highest education level: Not on file  Occupational History   Occupation: [employer]  Tobacco Use   Smoking status: Never   Smokeless tobacco: Never  Vaping Use   Vaping Use: Never used  Substance and Sexual Activity   Alcohol use: No    Alcohol/week: 0.0 standard drinks of alcohol   Drug use: No   Sexual activity: Not Currently    Partners: Male    Birth control/protection: Surgical    Comment: TUBAL LIGATION  Other Topics Concern   Not on file  Social History Narrative   Lives alone   Right-handed   Caffeine: sometimes 1 cup per day   Social Determinants of Health   Financial Resource Strain: Not on file  Food Insecurity: Not on file  Transportation Needs: Not on file  Physical Activity: Not on file  Stress: Not on file  Social Connections: Not on file  Intimate Partner Violence: Not on file    Review of Systems  Genitourinary:        Right vulvar lesion  All other systems reviewed and are negative.   PHYSICAL  EXAMINATION:    BP 120/76   Ht 5\' 5"  (1.651 m)   Wt 201 lb (91.2 kg)   LMP  (Approximate)   BMI 33.45 kg/m     General appearance: alert, cooperative and appears stated age   Pelvic: External genitalia:  right labial infected hair follicle, 5 - 7 mm, coming to a head.                Urethra:  normal appearing urethra with no masses, tenderness or lesions              Bartholins and Skenes: normal                 Vagina: normal appearing vagina with normal color and discharge, no lesions              Cervix: no lesions                Bimanual Exam:  Uterus:  normal size, contour, position, consistency, mobility, non-tender              Adnexa: no mass, fullness, tenderness       Chaperone was present for exam:  , CMA  ASSESSMENT  Folliculitis.   No abscess to drain.   PLAN  We discussed folliculitis and risk factors.  Avoid trying to do self drainage of the area.  Avoiding shaving.  Use loose fitting clothing.  Use soap and water and do not use skin moisturizers in the vulvar area.  Fu prn.    An After Visit Summary was printed and given to the patient.

## 2022-04-14 NOTE — Patient Instructions (Signed)
Folliculitis  Folliculitis is inflammation of the hair follicles. Folliculitis most commonly occurs on the scalp, thighs, legs, back, and buttocks. However, it can occur anywhere on the body. What are the causes? This condition may be caused by: A bacterial infection (common). A fungal infection. A viral infection. Contact with certain chemicals, especially oils and tars. Shaving or waxing. Greasy ointments or creams applied to the skin. Long-lasting folliculitis and folliculitis that keeps coming back may be caused by bacteria. This bacteria can live anywhere on your skin and is often found in the nostrils. What increases the risk? You are more likely to develop this condition if you have: A weakened immune system. Diabetes. Obesity. What are the signs or symptoms? Symptoms of this condition include: Redness. Soreness. Swelling. Itching. Small white or yellow, pus-filled, itchy spots (pustules) that appear over a reddened area. If there is an infection that goes deep into the follicle, these may develop into a boil (furuncle). A group of closely packed boils (carbuncle). These tend to form in hairy, sweaty areas of the body. How is this diagnosed? This condition is diagnosed with a skin exam. To find what is causing the condition, your health care provider may take a sample of one of the pustules or boils for testing in a lab. How is this treated? This condition may be treated by: Applying warm compresses to the affected areas. Taking an antibiotic medicine or applying an antibiotic medicine to the skin. Applying or bathing with an antiseptic solution. Taking an over-the-counter medicine to help with itching. Having a procedure to drain any pustules or boils. This may be done if a pustule or boil contains a lot of pus or fluid. Having laser hair removal. This may be done to treat long-lasting folliculitis. Follow these instructions at home: Managing pain and swelling  If  directed, apply heat to the affected area as often as told by your health care provider. Use the heat source that your health care provider recommends, such as a moist heat pack or a heating pad. Place a towel between your skin and the heat source. Leave the heat on for 20-30 minutes. Remove the heat if your skin turns bright red. This is especially important if you are unable to feel pain, heat, or cold. You may have a greater risk of getting burned. General instructions If you were prescribed an antibiotic medicine, take it or apply it as told by your health care provider. Do not stop using the antibiotic even if your condition improves. Check the irritated area every day for signs of infection. Check for: Redness, swelling, or pain. Fluid or blood. Warmth. Pus or a bad smell. Do not shave irritated skin. Take over-the-counter and prescription medicines only as told by your health care provider. Keep all follow-up visits as told by your health care provider. This is important. Get help right away if: You have more redness, swelling, or pain in the affected area. Red streaks are spreading from the affected area. You have a fever. Summary Folliculitis is inflammation of the hair follicles. Folliculitis most commonly occurs on the scalp, thighs, legs, back, and buttocks. This condition may be treated by taking an antibiotic medicine or applying an antibiotic medicine to the skin, and applying or bathing with an antiseptic solution. If you were prescribed an antibiotic medicine, take it or apply it as told by your health care provider. Do not stop using the antibiotic even if your condition improves. Get help right away if you have new   or worsening symptoms. Keep all follow-up visits as told by your health care provider. This is important. This information is not intended to replace advice given to you by your health care provider. Make sure you discuss any questions you have with your health  care provider. Document Revised: 08/14/2021 Document Reviewed: 08/14/2021 Elsevier Patient Education  2023 Elsevier Inc.  

## 2022-09-07 ENCOUNTER — Ambulatory Visit
Admission: EM | Admit: 2022-09-07 | Discharge: 2022-09-07 | Disposition: A | Discharge status: Home / Self Care | Payer: Commercial Managed Care - HMO | Attending: Nurse Practitioner | Admitting: Nurse Practitioner

## 2022-09-07 DIAGNOSIS — J069 Acute upper respiratory infection, unspecified: Secondary | ICD-10-CM | POA: Diagnosis present

## 2022-09-07 DIAGNOSIS — Z1152 Encounter for screening for COVID-19: Secondary | ICD-10-CM | POA: Insufficient documentation

## 2022-09-07 LAB — RESP PANEL BY RT-PCR (FLU A&B, COVID) ARPGX2
Influenza A by PCR: NEGATIVE
Influenza B by PCR: NEGATIVE
SARS Coronavirus 2 by RT PCR: POSITIVE — AB

## 2022-09-07 MED ORDER — PROMETHAZINE-DM 6.25-15 MG/5ML PO SYRP
5.0000 mL | ORAL_SOLUTION | Freq: Every evening | ORAL | 0 refills | Status: DC | PRN
Start: 2022-09-07 — End: 2023-02-22

## 2022-09-07 MED ORDER — BENZONATATE 100 MG PO CAPS: 100.0000 mg | ORAL_CAPSULE | Freq: Three times a day (TID) | ORAL | 0 refills | Status: DC | PRN

## 2022-09-07 NOTE — ED Triage Notes (Signed)
Pt reports cough, drainage, shortness of breath, chest congestion x 1 day. Pt has not taken any meds.

## 2022-09-07 NOTE — Discharge Instructions (Addendum)
You have a viral upper respiratory infection.  Symptoms should improve over the next week to 10 days.  If you develop chest pain or shortness of breath, go to the emergency room.  We have tested you today for COVID-19 and influenza.  You will see the results in Mychart and we will call you with positive results.    Please stay home and isolate until you are aware of the results.    Some things that can make you feel better are: - Increased rest - Increasing fluid with water/sugar free electrolytes - Acetaminophen and ibuprofen as needed for fever/pain - Salt water gargling, chloraseptic spray and throat lozenges - OTC guaifenesin (Mucinex) 600 mg twice daily - Saline sinus flushes or a neti pot - Humidifying the air -Tessalon Perles during the day as needed for dry cough and cough syrup at nighttime as needed for dry cough 

## 2022-09-07 NOTE — ED Provider Notes (Signed)
[facility]    CSN: [id number] Arrival date & time: 09/07/22  1231      History   Chief Complaint Chief Complaint  Patient presents with   Cough   Shortness of Breath         HPI Annette Larsen is a 51 y.o. female.   Patient presents for 1 day of dry cough, shortness of breath when taking a deep breath, nasal congestion, runny nose, postnasal drainage, right-sided ear pain, decreased appetite, and fatigue.  She denies fever, chest pain, chest congestion, sore throat, headache, abdominal pain, nausea/vomiting, diarrhea, new loss of taste or smell.  Reports she had COVID-19 back in 2021 and her taste/smell never returned to normal.  Reports she has had all of the COVID vaccines, has not had the most recent booster.  Has not taken thing for symptoms so far.  Reports she was exposed to lots of people over the weekend and last week that were sick.    Past Medical History:  Diagnosis Date   Acne    Acne    Benign cellular changes seen on Pap smear 04/2010   Condyloma 09/2007   Cyst of right Bartholin's gland 2015   IBS (irritable bowel syndrome)    LGSIL (low grade squamous intraepithelial dysplasia)    04/2007, 04/2009,     Patient Active Problem List   Diagnosis Date Noted   Hemorrhoids 12/14/2016   Rectal bleeding    Folliculitis 03/24/2013   S/P endometrial ablation 07/20/2012   Cervical dysplasia 07/20/2012    Past Surgical History:  Procedure Laterality Date   ENDOMETRIAL ABLATION  2005   FLEXIBLE SIGMOIDOSCOPY  03/15/2008   thrombosed hemorrhoid  12/2016   TUBAL LIGATION  1999    OB History     Gravida  2   Para  2   Term      Preterm      AB      Living  2      SAB      IAB      Ectopic      Multiple      Live Births               Home Medications    Prior to Admission medications   Medication Sig Start Date End Date Taking? Authorizing Provider  benzonatate (TESSALON) 100 MG capsule Take 1 capsule (100 mg  total) by mouth 3 (three) times daily as needed for cough. Do not take with alcohol or while driving or operating heavy machinery.  May cause drowsiness. 09/07/22  Yes Valentino Nose, NP  promethazine-dextromethorphan (PROMETHAZINE-DM) 6.25-15 MG/5ML syrup Take 5 mLs by mouth at bedtime as needed for cough. Do not take with alcohol or while driving or operating heavy machinery.  May cause drowsiness. 09/07/22  Yes Cathlean Marseilles A, NP  betamethasone, augmented, (DIPROLENE) 0.05 % lotion Apply topically 2 (two) times daily. 02/20/22   [provider]  Cholecalciferol (VITAMIN D3) 5000 UNITS CAPS Take by mouth.    [provider]  clobetasol ointment (TEMOVATE) 0.05 % Apply topically. 02/15/20   [provider]  Fluocinolone Acetonide Body 0.01 % OIL Apply 4-5 drops to affected areas on ears daily as needed. 02/14/20   [provider]  fluocinonide (LIDEX) 0.05 % external solution  09/06/14   [provider]  hydrocortisone 2.5 % cream APPLY TOPICALLY TO FACE DAILY AS NEEDED 02/15/20   [provider]  ketoconazole (NIZORAL) 2 % shampoo ketoconazole  2 % shampoo  apply as directed twice a day 2-3 TIMES A WEEK    [provider]    Family History Family History  Problem Relation Age of Onset   Diabetes Brother 29       died with complications from diabetes   Hypertension Father    Heart disease Father    Heart attack Father    Diabetes Mother    Migraines Mother     Social History Social History   Tobacco Use   Smoking status: Never   Smokeless tobacco: Never  Vaping Use   Vaping Use: Never used  Substance Use Topics   Alcohol use: No    Alcohol/week: 0.0 standard drinks of alcohol   Drug use: Never     Allergies   Cephalexin   Review of Systems Review of Systems Per HPI  Physical Exam Triage Vital Signs ED Triage Vitals  Enc Vitals Group     BP 09/07/22 1305 132/77     Pulse Rate 09/07/22 1305 98      Resp 09/07/22 1305 18     Temp 09/07/22 1305 98.9 F (37.2 C)     Temp Source 09/07/22 1305 Oral     SpO2 09/07/22 1305 98 %     Weight --      Height --      Head Circumference --      Peak Flow --      Pain Score 09/07/22 1304 0     Pain Loc --      Pain Edu? --      Excl. in [place]? --    No data found.  Updated Vital Signs BP 132/77 (BP Location: Right Arm)   Pulse 98   Temp 98.9 F (37.2 C) (Oral)   Resp 18   SpO2 98%   Visual Acuity Right Eye Distance:   Left Eye Distance:   Bilateral Distance:    Right Eye Near:   Left Eye Near:    Bilateral Near:     Physical Exam Vitals and nursing note reviewed.  Constitutional:      General: She is not in acute distress.    Appearance: Normal appearance. She is not ill-appearing or toxic-appearing.  HENT:     Head: Normocephalic and atraumatic.     Right Ear: Tympanic membrane, ear canal and external ear normal.     Left Ear: Tympanic membrane, ear canal and external ear normal.     Nose: No congestion or rhinorrhea.     Mouth/Throat:     Mouth: Mucous membranes are moist.     Pharynx: Oropharynx is clear. Posterior oropharyngeal erythema present. No oropharyngeal exudate.     Comments: Post nasal drainage Eyes:     General: No scleral icterus.    Extraocular Movements: Extraocular movements intact.  Cardiovascular:     Rate and Rhythm: Normal rate and regular rhythm.  Pulmonary:     Effort: Pulmonary effort is normal. No respiratory distress.     Breath sounds: Normal breath sounds. No wheezing, rhonchi or rales.     Comments: Patient speaking in complete sentences in no acute distress, occasional dry cough.  No accessory muscle use, respirations are even and unlabored. Abdominal:     General: Abdomen is flat. Bowel sounds are normal. There is no distension.     Palpations: Abdomen is soft.  Musculoskeletal:     Cervical back: Normal range of motion and neck supple.  Lymphadenopathy:     Cervical:  No cervical  adenopathy.  Skin:    General: Skin is warm and dry.     Coloration: Skin is not jaundiced or pale.     Findings: No erythema or rash.  Neurological:     Mental Status: She is alert and oriented to person, place, and time.  Psychiatric:        Behavior: Behavior is cooperative.      UC Treatments / Results  Labs (all labs ordered are listed, but only abnormal results are displayed) Labs Reviewed  RESP PANEL BY RT-PCR (FLU A&B, COVID) [id number]    EKG   Radiology No results found.  Procedures Procedures (including critical care time)  Medications Ordered in UC Medications - No data to display  Initial Impression / Assessment and Plan / UC Course  I have reviewed the triage vital signs and the nursing notes.  Pertinent labs & imaging results that were available during my care of the patient were reviewed by me and considered in my medical decision making (see chart for details).   Patient is well-appearing, normotensive, afebrile, not tachycardic, not tachypneic, oxygenating well on room air.    Encounter for screening for COVID-19 Viral URI with cough Suspect viral etiology COVID-19, influenza testing obtained Supportive care discussed - start cough suppressant ER and return precautions discussed Note given for work  The patient was given the opportunity to ask questions.  All questions answered to their satisfaction.  The patient is in agreement to this plan.    Final Clinical Impressions(s) / UC Diagnoses   Final diagnoses:  Encounter for screening for COVID-19  Viral URI with cough     Discharge Instructions      You have a viral upper respiratory infection.  Symptoms should improve over the next week to 10 days.  If you develop chest pain or shortness of breath, go to the emergency room.  We have tested you today for COVID-19 and influenza.  You will see the results in Mychart and we will call you with positive results.    Please stay home and isolate  until you are aware of the results.    Some things that can make you feel better are: - Increased rest - Increasing fluid with water/sugar free electrolytes - Acetaminophen and ibuprofen as needed for fever/pain - Salt water gargling, chloraseptic spray and throat lozenges - OTC guaifenesin (Mucinex) 600 mg twice daily - Saline sinus flushes or a neti pot - Humidifying the air -Tessalon Perles during the day as needed for dry cough and cough syrup at nighttime as needed for dry cough     ED Prescriptions     Medication Sig Dispense Auth. Provider   benzonatate (TESSALON) 100 MG capsule Take 1 capsule (100 mg total) by mouth 3 (three) times daily as needed for cough. Do not take with alcohol or while driving or operating heavy machinery.  May cause drowsiness. 21 capsule Cathlean Marseilles A, NP   promethazine-dextromethorphan (PROMETHAZINE-DM) 6.25-15 MG/5ML syrup Take 5 mLs by mouth at bedtime as needed for cough. Do not take with alcohol or while driving or operating heavy machinery.  May cause drowsiness. 118 mL Valentino Nose, NP      PDMP not reviewed this encounter.   Valentino Nose, NP 09/07/22 1423

## 2022-09-08 ENCOUNTER — Telehealth (HOSPITAL_COMMUNITY): Payer: Self-pay | Admitting: Emergency Medicine

## 2022-09-08 MED ORDER — MOLNUPIRAVIR EUA 200MG CAPSULE: 4.0000 | ORAL_CAPSULE | Freq: Two times a day (BID) | ORAL | 0 refills | Status: AC

## 2022-11-06 ENCOUNTER — Emergency Department (HOSPITAL_COMMUNITY)
Admission: EM | Admit: 2022-11-06 | Discharge: 2022-11-06 | Disposition: A | Discharge status: Home / Self Care | Payer: Commercial Managed Care - HMO | Attending: Emergency Medicine | Admitting: Emergency Medicine

## 2022-11-06 ENCOUNTER — Encounter (HOSPITAL_COMMUNITY): Payer: Self-pay

## 2022-11-06 ENCOUNTER — Other Ambulatory Visit: Payer: Self-pay

## 2022-11-06 DIAGNOSIS — M25511 Pain in right shoulder: Secondary | ICD-10-CM

## 2022-11-06 DIAGNOSIS — S161XXA Strain of muscle, fascia and tendon at neck level, initial encounter: Secondary | ICD-10-CM

## 2022-11-06 DIAGNOSIS — M542 Cervicalgia: Secondary | ICD-10-CM | POA: Insufficient documentation

## 2022-11-06 MED ORDER — IBUPROFEN 800 MG PO TABS
800.0000 mg | ORAL_TABLET | Freq: Three times a day (TID) | ORAL | 0 refills | Status: DC
Start: 2022-11-06 — End: 2023-02-22

## 2022-11-06 MED ORDER — IBUPROFEN 800 MG PO TABS
800.0000 mg | ORAL_TABLET | Freq: Once | ORAL | Status: AC
  Administered 2022-11-06: 800 mg via ORAL
  Filled 2022-11-06: qty 1

## 2022-11-06 MED ORDER — CYCLOBENZAPRINE HCL 10 MG PO TABS: 10.0000 mg | ORAL_TABLET | Freq: Two times a day (BID) | ORAL | 0 refills | Status: DC | PRN
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
## 2022-12-03 NOTE — Progress Notes (Deleted)
52 y.o. G2P2 Single [place] American female here for annual exam.    PCP:     No LMP recorded. Patient has had an ablation.           Sexually active: {yes no:314532}  The current method of family planning is ablation.    Exercising: {yes no:314532}  {types:19826} Smoker:  no  Health Maintenance: Pap:  12/16/21 neg: HR HPV neg, 02/28/20 neg: HR HPV neg,  12-28-18 Neg:Neg HR HPV, 08-27-17 Neg:Neg HR HPV, 08-20-16 Neg:Neg HR HPV  History of abnormal Pap:  yes, 2008, 2011 LSIL:no treatment to cervix, only repeat paps which reverted to normal.   MMG:  08/15/21 Breast Density Category B, BI-RADS CATEGORY 1 neg Colonoscopy:  2018 normal, next 2028 BMD:   n/a  Result  n/a TDaP:  12/16/21 Gardasil:   no HIV: 02/28/20 NR Hep C: 02/28/20 Neg Screening Labs:  Hb today: ***, Urine today: ***   reports that she has never smoked. She has never used smokeless tobacco. She reports that she does not drink alcohol and does not use drugs.  Past Medical History:  Diagnosis Date   Acne    Acne    Benign cellular changes seen on Pap smear 04/2010   Condyloma 09/2007   Cyst of right Bartholin's gland 2015   IBS (irritable bowel syndrome)    LGSIL (low grade squamous intraepithelial dysplasia)    04/2007, 04/2009,     Past Surgical History:  Procedure Laterality Date   ENDOMETRIAL ABLATION  2005   FLEXIBLE SIGMOIDOSCOPY  03/15/2008   thrombosed hemorrhoid  12/2016   TUBAL LIGATION  1999    Current Outpatient Medications  Medication Sig Dispense Refill   benzonatate (TESSALON) 100 MG capsule Take 1 capsule (100 mg total) by mouth 3 (three) times daily as needed for cough. Do not take with alcohol or while driving or operating heavy machinery.  May cause drowsiness. 21 capsule 0   betamethasone, augmented, (DIPROLENE) 0.05 % lotion Apply topically 2 (two) times daily.     Cholecalciferol (VITAMIN D3) 5000 UNITS CAPS Take by mouth.     clobetasol ointment (TEMOVATE) 0.05 % Apply topically.      cyclobenzaprine (FLEXERIL) 10 MG tablet Take 1 tablet (10 mg total) by mouth 2 (two) times daily as needed for muscle spasms. 20 tablet 0   Fluocinolone Acetonide Body 0.01 % OIL Apply 4-5 drops to affected areas on ears daily as needed.     fluocinonide (LIDEX) 0.05 % external solution   0   hydrocortisone 2.5 % cream APPLY TOPICALLY TO FACE DAILY AS NEEDED     ibuprofen (ADVIL) 800 MG tablet Take 1 tablet (800 mg total) by mouth 3 (three) times daily. 21 tablet 0   ketoconazole (NIZORAL) 2 % shampoo ketoconazole 2 % shampoo  apply as directed twice a day 2-3 TIMES A WEEK     promethazine-dextromethorphan (PROMETHAZINE-DM) 6.25-15 MG/5ML syrup Take 5 mLs by mouth at bedtime as needed for cough. Do not take with alcohol or while driving or operating heavy machinery.  May cause drowsiness. 118 mL 0   No current facility-administered medications for this visit.    Family History  Problem Relation Age of Onset   Diabetes Brother 59       died with complications from diabetes   Hypertension Father    Heart disease Father    Heart attack Father    Diabetes Mother    Migraines Mother     Review of Systems  Exam:  There were no vitals taken for this visit.    General appearance: alert, cooperative and appears stated age Head: normocephalic, without obvious abnormality, atraumatic Neck: no adenopathy, supple, symmetrical, trachea midline and thyroid normal to inspection and palpation Lungs: clear to auscultation bilaterally Breasts: normal appearance, no masses or tenderness, No nipple retraction or dimpling, No nipple discharge or bleeding, No axillary adenopathy Heart: regular rate and rhythm Abdomen: soft, non-tender; no masses, no organomegaly Extremities: extremities normal, atraumatic, no cyanosis or edema Skin: skin color, texture, turgor normal. No rashes or lesions Lymph nodes: cervical, supraclavicular, and axillary nodes normal. Neurologic: grossly normal  Pelvic: External  genitalia:  no lesions              No abnormal inguinal nodes palpated.              Urethra:  normal appearing urethra with no masses, tenderness or lesions              Bartholins and Skenes: normal                 Vagina: normal appearing vagina with normal color and discharge, no lesions              Cervix: no lesions              Pap taken: {yes no:314532} Bimanual Exam:  Uterus:  normal size, contour, position, consistency, mobility, non-tender              Adnexa: no mass, fullness, tenderness              Rectal exam: {yes no:314532}.  Confirms.              Anus:  normal sphincter tone, no lesions  Chaperone was present for exam:  ***  Assessment:   Well woman visit with gynecologic exam.   Plan: Mammogram screening discussed. Self breast awareness reviewed. Pap and HR HPV as above. Guidelines for Calcium, Vitamin D, regular exercise program including cardiovascular and weight bearing exercise.   Follow up annually and prn.   Additional counseling given.  {yes [id number]. _______ minutes face to face time of which over 50% was spent in counseling.    After visit summary provided.

## 2022-12-17 ENCOUNTER — Ambulatory Visit: Payer: Commercial Managed Care - HMO | Admitting: Obstetrics and Gynecology

## 2023-01-08 ENCOUNTER — Other Ambulatory Visit: Payer: Self-pay | Admitting: Otolaryngology

## 2023-01-08 DIAGNOSIS — J329 Chronic sinusitis, unspecified: Secondary | ICD-10-CM

## 2023-01-19 ENCOUNTER — Ambulatory Visit
Admission: RE | Admit: 2023-01-19 | Discharge: 2023-01-19 | Disposition: A | Discharge status: Home / Self Care | Payer: Commercial Managed Care - HMO | Source: Ambulatory Visit | Attending: Otolaryngology | Admitting: Otolaryngology

## 2023-01-19 DIAGNOSIS — J329 Chronic sinusitis, unspecified: Secondary | ICD-10-CM

## 2023-02-02 ENCOUNTER — Other Ambulatory Visit: Payer: Commercial Managed Care - HMO

## 2023-02-08 NOTE — Progress Notes (Signed)
52 y.o. G2P2 Single African American female here for annual exam.    Has occasional brown discharge, like a possible period.  No real hot flashes or night sweats.   Not sexually active for 2 years.   Taking vit D and MVI.   PCP:   Dr. Cyndia Bent  No LMP recorded. Patient has had an ablation.           Sexually active: No.  The current method of family planning is ablation/BTL.    Exercising: Yes.     Working with a Psychologist, educational twice a week Smoker:  no  Health Maintenance: Pap:  12/16/21 neg: HR HPV neg, 02/28/20 neg: HR HPV neg History of abnormal Pap:  yes, 2008, 2011 LSIL:no treatment to cervix, only repeat paps which reverted to normal.    MMG:  08/15/21 Breast Density Category B, BI-RADS CAT 1 neg.  She will scheduled.  Colonoscopy:  2018 normal.  Had a normal colonoscopy in May, 2023, [place].  BMD:   n/a  Result  n/a TDaP:  12/16/21 Gardasil:   no HIV: 02/28/20 NR Hep C:02/28/20 Neg Screening Labs:  PCP   reports that she has never smoked. She has never used smokeless tobacco. She reports that she does not drink alcohol and does not use drugs.  Past Medical History:  Diagnosis Date   Acne    Acne    Benign cellular changes seen on Pap smear 04/2010   Condyloma 09/2007   Cyst of right Bartholin's gland 2015   IBS (irritable bowel syndrome)    LGSIL (low grade squamous intraepithelial dysplasia)    04/2007, 04/2009,     Past Surgical History:  Procedure Laterality Date   ENDOMETRIAL ABLATION  2005   FLEXIBLE SIGMOIDOSCOPY  03/15/2008   thrombosed hemorrhoid  12/2016   TOOTH EXTRACTION  2023   TUBAL LIGATION  1999    Current Outpatient Medications  Medication Sig Dispense Refill   benzonatate (TESSALON) 100 MG capsule Take 1 capsule (100 mg total) by mouth 3 (three) times daily as needed for cough. Do not take with alcohol or while driving or operating heavy machinery.  May cause drowsiness. 21 capsule 0   betamethasone, augmented, (DIPROLENE) 0.05 % lotion Apply  topically 2 (two) times daily.     Cholecalciferol (VITAMIN D3) 5000 UNITS CAPS Take by mouth.     clobetasol ointment (TEMOVATE) 0.05 % Apply topically.     Fluocinolone Acetonide Body 0.01 % OIL Apply 4-5 drops to affected areas on ears daily as needed.     fluocinonide (LIDEX) 0.05 % external solution   0   hydrocortisone 2.5 % cream APPLY TOPICALLY TO FACE DAILY AS NEEDED     ketoconazole (NIZORAL) 2 % shampoo ketoconazole 2 % shampoo  apply as directed twice a day 2-3 TIMES A WEEK     No current facility-administered medications for this visit.    Family History  Problem Relation Age of Onset   Diabetes Brother 35       died with complications from diabetes   Hypertension Father    Heart disease Father    Heart attack Father    Diabetes Mother    Migraines Mother     Review of Systems  All other systems reviewed and are negative.   Exam:   BP 122/84 (BP Location: Right Arm, Patient Position: Sitting, Cuff Size: Large)   Pulse 67   Ht 5' 6.25" (1.683 m)   Wt 196 lb (88.9 kg)   SpO2 99%  BMI 31.40 kg/m     General appearance: alert, cooperative and appears stated age Head: normocephalic, without obvious abnormality, atraumatic Neck: no adenopathy, supple, symmetrical, trachea midline and thyroid normal to inspection and palpation Lungs: clear to auscultation bilaterally Breasts: normal appearance, no masses or tenderness, No nipple retraction or dimpling, No nipple discharge or bleeding, No axillary adenopathy Heart: regular rate and rhythm Abdomen: soft, non-tender; no masses, no organomegaly Extremities: extremities normal, atraumatic, no cyanosis or edema Skin: skin color, texture, turgor normal. No rashes or lesions Lymph nodes: cervical, supraclavicular, and axillary nodes normal. Neurologic: grossly normal  Pelvic: External genitalia:  no lesions              No abnormal inguinal nodes palpated.              Urethra:  normal appearing urethra with no masses,  tenderness or lesions              Bartholins and Skenes: normal                 Vagina: normal appearing vagina with normal color and discharge, no lesions              Cervix: no lesions              Pap taken: no Bimanual Exam:  Uterus:  normal size, contour, position, consistency, mobility, non-tender              Adnexa: no mass, fullness, tenderness              Rectal exam: yes.  Confirms.              Anus:  normal sphincter tone, no lesions  Chaperone was present for exam:  Warren Lacy, CMA  Assessment:   Well woman visit with gynecologic exam. 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  Status post BTL.   Status post endometrial ablation.   History of HSV 1. Hx IBS.     Plan: Mammogram screening discussed.  Patient will scheduled. Self breast awareness reviewed. Pap and HR HPV 2028.  We discussed cervical cancer screening guidelines. Guidelines for Calcium, Vitamin D, regular exercise program including cardiovascular and weight bearing exercise. Signs and symptoms of menopause discussed.   Follow up annually and prn.   After visit summary provided.

## 2023-02-22 ENCOUNTER — Encounter: Payer: Self-pay | Admitting: Obstetrics and Gynecology

## 2023-02-22 ENCOUNTER — Ambulatory Visit (INDEPENDENT_AMBULATORY_CARE_PROVIDER_SITE_OTHER): Payer: Commercial Managed Care - HMO | Admitting: Obstetrics and Gynecology

## 2023-02-22 VITALS — BP 122/84 | HR 67 | Ht 66.25 in | Wt 196.0 lb

## 2023-02-22 DIAGNOSIS — Z01419 Encounter for gynecological examination (general) (routine) without abnormal findings: Secondary | ICD-10-CM | POA: Diagnosis not present

## 2023-02-22 NOTE — Patient Instructions (Signed)

## 2023-04-01 ENCOUNTER — Other Ambulatory Visit: Payer: Self-pay | Admitting: Obstetrics and Gynecology

## 2023-04-01 DIAGNOSIS — Z1231 Encounter for screening mammogram for malignant neoplasm of breast: Secondary | ICD-10-CM

## 2023-04-20 ENCOUNTER — Ambulatory Visit: Payer: Commercial Managed Care - HMO

## 2023-04-21 ENCOUNTER — Ambulatory Visit
Admission: RE | Admit: 2023-04-21 | Discharge: 2023-04-21 | Disposition: A | Discharge status: Home / Self Care | Payer: Commercial Managed Care - HMO | Source: Ambulatory Visit | Attending: Obstetrics and Gynecology | Admitting: Obstetrics and Gynecology

## 2023-04-21 DIAGNOSIS — Z1231 Encounter for screening mammogram for malignant neoplasm of breast: Secondary | ICD-10-CM

## 2023-04-23 ENCOUNTER — Other Ambulatory Visit: Payer: Self-pay | Admitting: Obstetrics and Gynecology

## 2023-04-23 ENCOUNTER — Telehealth: Payer: Self-pay

## 2023-04-23 DIAGNOSIS — R928 Other abnormal and inconclusive findings on diagnostic imaging of breast: Secondary | ICD-10-CM

## 2023-04-23 NOTE — Telephone Encounter (Signed)
Pt LVM in triage line re: yesterday's mammo results and callback for dx imaging.   Spoke w/ pt and advised her that when screenings see areas that warrant further investigation, they will cb. Areas seen could be of benign or of malignant nature. However, it is our job as HCPs to f/u to r/o malignancy.   Pt voiced understanding. Now scheduled for dx w/ [place] prn on 04/29/2023. Will route to provider for final review and close.

## 2023-04-29 ENCOUNTER — Ambulatory Visit
Admission: RE | Admit: 2023-04-29 | Discharge: 2023-04-29 | Disposition: A | Discharge status: Home / Self Care | Payer: Commercial Managed Care - HMO | Source: Ambulatory Visit | Attending: Obstetrics and Gynecology | Admitting: Obstetrics and Gynecology

## 2023-04-29 DIAGNOSIS — R928 Other abnormal and inconclusive findings on diagnostic imaging of breast: Secondary | ICD-10-CM

## 2023-06-16 ENCOUNTER — Institutional Professional Consult (permissible substitution): Payer: Commercial Managed Care - HMO | Admitting: Pulmonary Disease

## 2023-06-23 ENCOUNTER — Other Ambulatory Visit: Payer: Self-pay | Admitting: Otolaryngology

## 2023-07-06 ENCOUNTER — Encounter (HOSPITAL_BASED_OUTPATIENT_CLINIC_OR_DEPARTMENT_OTHER): Payer: Self-pay | Admitting: Otolaryngology

## 2023-07-06 ENCOUNTER — Other Ambulatory Visit: Payer: Self-pay

## 2023-07-11 NOTE — Anesthesia Preprocedure Evaluation (Signed)
Anesthesia Evaluation  Patient identified by MRN, date of birth, ID band Patient awake    Reviewed: Allergy & Precautions, H&P , NPO status , Patient's Chart, lab work & pertinent test results  Airway Mallampati: II  TM Distance: >3 FB Neck ROM: Full    Dental no notable dental hx. (+) Teeth Intact, Dental Advisory Given, Caps   Pulmonary neg pulmonary ROS   Pulmonary exam normal breath sounds clear to auscultation       Cardiovascular Exercise Tolerance: Good negative cardio ROS Normal cardiovascular exam Rhythm:Regular Rate:Normal     Neuro/Psych negative neurological ROS  negative psych ROS   GI/Hepatic negative GI ROS, Neg liver ROS,,,  Endo/Other  negative endocrine ROS    Renal/GU negative Renal ROS  negative genitourinary   Musculoskeletal negative musculoskeletal ROS (+)    Abdominal   Peds negative pediatric ROS (+)  Hematology negative hematology ROS (+)   Anesthesia Other Findings   Reproductive/Obstetrics negative OB ROS                             Anesthesia Physical Anesthesia Plan  ASA: 2  Anesthesia Plan: General   Post-op Pain Management: Tylenol PO (pre-op)* and Celebrex PO (pre-op)*   Induction: Intravenous  PONV Risk Score and Plan: 3 and Ondansetron, Dexamethasone and Treatment may vary due to age or medical condition  Airway Management Planned: Oral ETT  Additional Equipment: None  Intra-op Plan:   Post-operative Plan: Extubation in OR  Informed Consent: I have reviewed the patients History and Physical, chart, labs and discussed the procedure including the risks, benefits and alternatives for the proposed anesthesia with the patient or authorized representative who has indicated his/her understanding and acceptance.       Plan Discussed with: Anesthesiologist and CRNA  Anesthesia Plan Comments: (  )        Anesthesia Quick  Evaluation

## 2023-07-12 ENCOUNTER — Encounter (HOSPITAL_BASED_OUTPATIENT_CLINIC_OR_DEPARTMENT_OTHER)
Admission: RE | Disposition: A | Discharge status: Home / Self Care | Payer: Self-pay | Source: Home / Self Care | Attending: Otolaryngology

## 2023-07-12 ENCOUNTER — Other Ambulatory Visit: Payer: Self-pay

## 2023-07-12 ENCOUNTER — Ambulatory Visit (HOSPITAL_BASED_OUTPATIENT_CLINIC_OR_DEPARTMENT_OTHER): Payer: Commercial Managed Care - HMO | Admitting: Anesthesiology

## 2023-07-12 ENCOUNTER — Ambulatory Visit (HOSPITAL_BASED_OUTPATIENT_CLINIC_OR_DEPARTMENT_OTHER): Payer: Self-pay | Admitting: Anesthesiology

## 2023-07-12 ENCOUNTER — Encounter (HOSPITAL_BASED_OUTPATIENT_CLINIC_OR_DEPARTMENT_OTHER): Payer: Self-pay | Admitting: Otolaryngology

## 2023-07-12 ENCOUNTER — Ambulatory Visit (HOSPITAL_BASED_OUTPATIENT_CLINIC_OR_DEPARTMENT_OTHER)
Admission: RE | Admit: 2023-07-12 | Discharge: 2023-07-12 | Disposition: A | Discharge status: Home / Self Care | Payer: Commercial Managed Care - HMO | Attending: Otolaryngology | Admitting: Otolaryngology

## 2023-07-12 DIAGNOSIS — J338 Other polyp of sinus: Secondary | ICD-10-CM | POA: Diagnosis not present

## 2023-07-12 DIAGNOSIS — J320 Chronic maxillary sinusitis: Secondary | ICD-10-CM | POA: Insufficient documentation

## 2023-07-12 DIAGNOSIS — M2518 Fistula, other specified site: Secondary | ICD-10-CM

## 2023-07-12 DIAGNOSIS — Z01818 Encounter for other preprocedural examination: Secondary | ICD-10-CM

## 2023-07-12 HISTORY — PX: NASAL SINUS SURGERY: SHX719

## 2023-07-12 HISTORY — PX: CLOSURE ORAL ANTRAL FISTULA: SHX6286

## 2023-07-12 HISTORY — PX: MAXILLARY ANTROSTOMY: SHX2003

## 2023-07-12 LAB — POCT PREGNANCY, URINE: Preg Test, Ur: NEGATIVE

## 2023-07-12 SURGERY — SINUS SURGERY, ENDOSCOPIC
Anesthesia: General | Site: Nose | Laterality: Right

## 2023-07-12 MED ORDER — MEPERIDINE HCL 25 MG/ML IJ SOLN: 6.2500 mg | INTRAMUSCULAR | Status: DC | PRN

## 2023-07-12 MED ORDER — MIDAZOLAM HCL 5 MG/5ML IJ SOLN
INTRAMUSCULAR | Status: DC | PRN
  Administered 2023-07-12: 2 mg via INTRAVENOUS

## 2023-07-12 MED ORDER — AMOXICILLIN 875 MG PO TABS: 875.0000 mg | ORAL_TABLET | Freq: Two times a day (BID) | ORAL | 0 refills | Status: AC

## 2023-07-12 MED ORDER — PROPOFOL 10 MG/ML IV BOLUS
INTRAVENOUS | Status: AC
  Filled 2023-07-12: qty 20

## 2023-07-12 MED ORDER — PROPOFOL 10 MG/ML IV BOLUS
INTRAVENOUS | Status: DC | PRN
  Administered 2023-07-12: 160 mg via INTRAVENOUS

## 2023-07-12 MED ORDER — ONDANSETRON HCL 4 MG/2ML IJ SOLN
INTRAMUSCULAR | Status: AC
  Filled 2023-07-12: qty 2

## 2023-07-12 MED ORDER — MIDAZOLAM HCL 2 MG/2ML IJ SOLN
INTRAMUSCULAR | Status: AC
  Filled 2023-07-12: qty 2

## 2023-07-12 MED ORDER — SODIUM CHLORIDE 0.9 % IV SOLN
INTRAVENOUS | Status: AC | PRN
  Administered 2023-07-12: 100 mL

## 2023-07-12 MED ORDER — LIDOCAINE-EPINEPHRINE 1 %-1:100000 IJ SOLN
INTRAMUSCULAR | Status: AC
  Filled 2023-07-12: qty 1

## 2023-07-12 MED ORDER — OXYCODONE HCL 5 MG/5ML PO SOLN: 5.0000 mg | Freq: Once | ORAL | Status: DC | PRN

## 2023-07-12 MED ORDER — ONDANSETRON HCL 4 MG/2ML IJ SOLN: 4.0000 mg | Freq: Once | INTRAMUSCULAR | Status: DC | PRN

## 2023-07-12 MED ORDER — DEXMEDETOMIDINE HCL IN NACL 80 MCG/20ML IV SOLN
INTRAVENOUS | Status: DC | PRN
  Administered 2023-07-12: 8 ug via INTRAVENOUS
  Administered 2023-07-12: 4 ug via INTRAVENOUS

## 2023-07-12 MED ORDER — OXYMETAZOLINE HCL 0.05 % NA SOLN
NASAL | Status: AC
  Filled 2023-07-12: qty 30

## 2023-07-12 MED ORDER — SUGAMMADEX SODIUM 200 MG/2ML IV SOLN
INTRAVENOUS | Status: DC | PRN
Start: 2023-07-12 — End: 2023-07-12
  Administered 2023-07-12: 200 mg via INTRAVENOUS

## 2023-07-12 MED ORDER — ROCURONIUM BROMIDE 100 MG/10ML IV SOLN
INTRAVENOUS | Status: DC | PRN
  Administered 2023-07-12: 10 mg via INTRAVENOUS
  Administered 2023-07-12: 50 mg via INTRAVENOUS

## 2023-07-12 MED ORDER — LIDOCAINE HCL (CARDIAC) PF 100 MG/5ML IV SOSY
PREFILLED_SYRINGE | INTRAVENOUS | Status: DC | PRN
  Administered 2023-07-12: 50 mg via INTRAVENOUS

## 2023-07-12 MED ORDER — CELECOXIB 200 MG PO CAPS
200.0000 mg | ORAL_CAPSULE | Freq: Once | ORAL | Status: AC
  Administered 2023-07-12: 200 mg via ORAL

## 2023-07-12 MED ORDER — CELECOXIB 200 MG PO CAPS
ORAL_CAPSULE | ORAL | Status: AC
  Filled 2023-07-12: qty 1

## 2023-07-12 MED ORDER — ACETAMINOPHEN 500 MG PO TABS
ORAL_TABLET | ORAL | Status: AC
  Filled 2023-07-12: qty 2

## 2023-07-12 MED ORDER — LIDOCAINE 2% (20 MG/ML) 5 ML SYRINGE
INTRAMUSCULAR | Status: AC
  Filled 2023-07-12: qty 5

## 2023-07-12 MED ORDER — OXYMETAZOLINE HCL 0.05 % NA SOLN
NASAL | Status: DC | PRN
  Administered 2023-07-12: 1 via TOPICAL

## 2023-07-12 MED ORDER — DEXAMETHASONE SODIUM PHOSPHATE 4 MG/ML IJ SOLN
INTRAMUSCULAR | Status: DC | PRN
  Administered 2023-07-12: 10 mg via INTRAVENOUS

## 2023-07-12 MED ORDER — LACTATED RINGERS IV SOLN: INTRAVENOUS | Status: DC

## 2023-07-12 MED ORDER — DEXAMETHASONE SODIUM PHOSPHATE 10 MG/ML IJ SOLN
INTRAMUSCULAR | Status: AC
  Filled 2023-07-12: qty 1

## 2023-07-12 MED ORDER — ONDANSETRON HCL 4 MG/2ML IJ SOLN
INTRAMUSCULAR | Status: DC | PRN
  Administered 2023-07-12: 4 mg via INTRAVENOUS

## 2023-07-12 MED ORDER — FENTANYL CITRATE (PF) 100 MCG/2ML IJ SOLN: 25.0000 ug | INTRAMUSCULAR | Status: DC | PRN

## 2023-07-12 MED ORDER — OXYCODONE HCL 5 MG PO TABS: 5.0000 mg | ORAL_TABLET | Freq: Once | ORAL | Status: DC | PRN

## 2023-07-12 MED ORDER — MUPIROCIN 2 % EX OINT
TOPICAL_OINTMENT | CUTANEOUS | Status: DC | PRN
  Administered 2023-07-12: 1 via NASAL

## 2023-07-12 MED ORDER — CLINDAMYCIN PHOSPHATE 900 MG/50ML IV SOLN
INTRAVENOUS | Status: DC | PRN
Start: 2023-07-12 — End: 2023-07-12
  Administered 2023-07-12: 900 mg via INTRAVENOUS

## 2023-07-12 MED ORDER — ACETAMINOPHEN 500 MG PO TABS
1000.0000 mg | ORAL_TABLET | Freq: Once | ORAL | Status: AC
  Administered 2023-07-12: 1000 mg via ORAL

## 2023-07-12 MED ORDER — CLINDAMYCIN PHOSPHATE 900 MG/50ML IV SOLN
INTRAVENOUS | Status: AC
  Filled 2023-07-12: qty 50

## 2023-07-12 MED ORDER — ACETAMINOPHEN 160 MG/5ML PO SOLN: 325.0000 mg | ORAL | Status: DC | PRN

## 2023-07-12 MED ORDER — MUPIROCIN 2 % EX OINT
TOPICAL_OINTMENT | CUTANEOUS | Status: AC
  Filled 2023-07-12: qty 22

## 2023-07-12 MED ORDER — FENTANYL CITRATE (PF) 100 MCG/2ML IJ SOLN
INTRAMUSCULAR | Status: AC
  Filled 2023-07-12: qty 2

## 2023-07-12 MED ORDER — FENTANYL CITRATE (PF) 100 MCG/2ML IJ SOLN
INTRAMUSCULAR | Status: DC | PRN
  Administered 2023-07-12: 100 ug via INTRAVENOUS
  Administered 2023-07-12: 50 ug via INTRAVENOUS

## 2023-07-12 MED ORDER — ACETAMINOPHEN 325 MG PO TABS: 325.0000 mg | ORAL_TABLET | ORAL | Status: DC | PRN

## 2023-07-12 MED ORDER — LIDOCAINE-EPINEPHRINE 1 %-1:100000 IJ SOLN
INTRAMUSCULAR | Status: DC | PRN
  Administered 2023-07-12: 3 mL

## 2023-07-12 SURGICAL SUPPLY — 59 items
ATTRACTOMAT 16X20 MAGNETIC DRP (DRAPES) IMPLANT
BLADE RAD40 ROTATE 4M 4 5PK (BLADE) IMPLANT
BLADE RAD60 ROTATE M4 4 5PK (BLADE) IMPLANT
BLADE SURG 15 STRL LF DISP TIS (BLADE) ×2 IMPLANT
BLADE SURG 15 STRL SS (BLADE)
BLADE TRICUT ROTATE M4 4 5PK (BLADE) IMPLANT
BUR HS RAD FRONTAL 3 (BURR) IMPLANT
CANISTER SUC SOCK COL 7IN (MISCELLANEOUS) ×4 IMPLANT
CANISTER SUCT 1200ML W/VALVE (MISCELLANEOUS) ×4 IMPLANT
COAGULATOR SUCT 8FR VV (MISCELLANEOUS) IMPLANT
COAGULATOR SUCT SWTCH 10FR 6 (ELECTROSURGICAL) IMPLANT
COVER BACK TABLE 60X90IN (DRAPES) ×2 IMPLANT
COVER MAYO STAND STRL (DRAPES) ×2 IMPLANT
DEFOGGER MIRROR 1QT (MISCELLANEOUS) ×4 IMPLANT
DEPRESSOR TONGUE 6 IN STERILE (GAUZE/BANDAGES/DRESSINGS) IMPLANT
DRSG NASAL KENNEDY LMNT 8CM (GAUZE/BANDAGES/DRESSINGS) IMPLANT
DRSG NASOPORE 8CM (GAUZE/BANDAGES/DRESSINGS) IMPLANT
DRSG TELFA 3X8 NADH STRL (GAUZE/BANDAGES/DRESSINGS) IMPLANT
ELECT COATED BLADE 2.86 ST (ELECTRODE) IMPLANT
ELECT NDL BLADE 2-5/6 (NEEDLE) IMPLANT
ELECT NEEDLE BLADE 2-5/6 (NEEDLE)
ELECT REM PT RETURN 9FT ADLT (ELECTROSURGICAL) ×2
ELECTRODE REM PT RTRN 9FT ADLT (ELECTROSURGICAL) ×2 IMPLANT
GAUZE SPONGE 2X2 STRL 8-PLY (GAUZE/BANDAGES/DRESSINGS) ×2 IMPLANT
GLOVE BIO SURGEON STRL SZ7 (GLOVE) IMPLANT
GLOVE BIO SURGEON STRL SZ7.5 (GLOVE) ×2 IMPLANT
GLOVE BIOGEL PI IND STRL 7.0 (GLOVE) IMPLANT
GLOVE SURG SS PI 7.0 STRL IVOR (GLOVE) IMPLANT
GOWN STRL REUS W/ TWL LRG LVL3 (GOWN DISPOSABLE) ×4 IMPLANT
GOWN STRL REUS W/TWL LRG LVL3 (GOWN DISPOSABLE) ×6
HEMOSTAT SURGICEL 2X14 (HEMOSTASIS) IMPLANT
IV NS 500ML (IV SOLUTION) ×2
IV NS 500ML BAXH (IV SOLUTION) ×2 IMPLANT
IV SET EXT 30 76VOL 4 MALE LL (IV SETS) IMPLANT
NDL HYPO 25X1 1.5 SAFETY (NEEDLE) ×2 IMPLANT
NDL SPNL 25GX3.5 QUINCKE BL (NEEDLE) IMPLANT
NEEDLE HYPO 25X1 1.5 SAFETY (NEEDLE) ×2
NEEDLE SPNL 25GX3.5 QUINCKE BL (NEEDLE)
NS IRRIG 1000ML POUR BTL (IV SOLUTION) ×2 IMPLANT
PACK BASIN DAY SURGERY FS (CUSTOM PROCEDURE TRAY) ×2 IMPLANT
PACK ENT DAY SURGERY (CUSTOM PROCEDURE TRAY) ×2 IMPLANT
PENCIL SMOKE EVACUATOR (MISCELLANEOUS) IMPLANT
SHEET MEDIUM DRAPE 40X70 STRL (DRAPES) ×2 IMPLANT
SLEEVE SCD COMPRESS KNEE MED (STOCKING) ×2 IMPLANT
SPIKE FLUID TRANSFER (MISCELLANEOUS) IMPLANT
SPLINT NASAL AIRWAY SILICONE (MISCELLANEOUS) IMPLANT
SPONGE NEURO XRAY DETECT 1X3 (DISPOSABLE) ×2 IMPLANT
SUCTION TUBE FRAZIER 10FR DISP (SUCTIONS) IMPLANT
SUT ETHILON 3 0 PS 1 (SUTURE) IMPLANT
SUT PLAIN 4 0 ~~LOC~~ 1 (SUTURE) IMPLANT
SUT VIC AB 4-0 PS2 27 (SUTURE) ×2 IMPLANT
SUT VIC AB 4-0 RB1 27 (SUTURE)
SUT VIC AB 4-0 RB1 27X BRD (SUTURE) IMPLANT
SYR 50ML LL SCALE MARK (SYRINGE) IMPLANT
SYR CONTROL 10ML LL (SYRINGE) IMPLANT
TOWEL GREEN STERILE FF (TOWEL DISPOSABLE) ×2 IMPLANT
TUBE CONNECTING 20X1/4 (TUBING) ×2 IMPLANT
TUBE SALEM SUMP 16F (TUBING) ×2 IMPLANT
YANKAUER SUCT BULB TIP NO VENT (SUCTIONS) ×2 IMPLANT

## 2023-07-12 NOTE — H&P (Signed)
Cc: Chronic right maxillary sinusitis, oral antral fistula  HPI: The patient is a 52 year old female who returns today for her follow-up evaluation.  The patient was last seen in March 2024. At that time, she was complaining of chronic right-sided sinusitis and oroantral fistula.  Her symptoms started in August 2023, after she underwent dental extraction of a right maxillary molar.  She complained of chronic right-sided facial pain, nasal congestion, and nasal drainage.  She was treated with multiple courses of antibiotics.  She subsequently underwent a sinus CT scan.  The CT showed opacification of the right maxillary sinus and a right oral antral fistula.  The patient returns today reporting persistent right facial pain and pressure.  She continues to have drainage from her right oral antral fistula.  She is interested in more definitive treatment.  Exam: General: Communicates without difficulty, well nourished, no acute distress. Head: Normocephalic, no evidence injury, no tenderness, facial buttresses intact without stepoff. Face/sinus: No tenderness to palpation and percussion. Facial movement is normal and symmetric. Eyes: PERRL, EOMI. No scleral icterus, conjunctivae clear. Neuro: CN II exam reveals vision grossly intact.  No nystagmus at any point of gaze. Ears: Auricles well formed without lesions.  Ear canals are intact without mass or lesion.  No erythema or edema is appreciated.  The TMs are intact without fluid. Nose: External evaluation reveals normal support and skin without lesions.  Dorsum is intact.  Anterior rhinoscopy reveals congested mucosa over anterior aspect of inferior turbinates and intact septum. Oral:  Oral cavity and oropharynx are intact, symmetric, without erythema or edema.  Mucosa is moist without lesions.  Neck: Full range of motion without pain.  There is no significant lymphadenopathy.  No masses palpable.  Thyroid bed within normal limits to palpation.  Parotid glands and  submandibular glands equal bilaterally without mass.  Trachea is midline. Neuro:  CN 2-12 grossly intact. Gait normal. A flexible scope was inserted into the right nasal cavity.  Endoscopy of the interior nasal cavity, superior, inferior, and middle meatus was performed. The sphenoid-ethmoid recess was examined. Edematous mucosa was noted.  The right middle meatus was inflamed.  No polyp, mass, or lesion was appreciated. Olfactory cleft was clear.  Nasopharynx was clear.  Turbinates were hypertrophied but without mass. The procedure was repeated on the contralateral side with similar findings.  The patient tolerated the procedure well.   Assessment: 1.  Chronic right maxillary rhinosinusitis. 2.  Persistent right oroantral fistula, secondary to her previous dental surgery.  Plan: 1.  The nasal endoscopy findings are reviewed with the patient. 2.  The patient has not responded to multiple antibiotic treatments. 3.  Based on the above findings, the patient will benefit from surgical intervention with repair of the right oroantral fistula as well as right maxillary antrostomy with tissue removal.  The risk, benefits, and details of the procedures are reviewed.  Questions are invited and answered. 4.  The patient would like to proceed with the procedures.

## 2023-07-12 NOTE — Anesthesia Procedure Notes (Signed)
Procedure Name: Intubation Date/Time: 07/12/2023 9:08 AM  Performed by: Burna Cash, CRNAPre-anesthesia Checklist: Patient identified, Emergency Drugs available, Suction available and Patient being monitored Patient Re-evaluated:Patient Re-evaluated prior to induction Oxygen Delivery Method: Circle system utilized Preoxygenation: Pre-oxygenation with 100% oxygen Induction Type: IV induction Ventilation: Mask ventilation without difficulty Laryngoscope Size: Mac and 4 Grade View: Grade I Tube type: Oral Tube size: 7.0 mm Number of attempts: 1 Airway Equipment and Method: Stylet and Oral airway Placement Confirmation: ETT inserted through vocal cords under direct vision, positive ETCO2 and breath sounds checked- equal and bilateral Secured at: 21 cm Tube secured with: Tape Dental Injury: Teeth and Oropharynx as per pre-operative assessment

## 2023-07-12 NOTE — Discharge Instructions (Addendum)
POSTOPERATIVE INSTRUCTIONS FOR PATIENTS HAVING NASAL OR SINUS OPERATIONS ACTIVITY: Restrict activity at home for the first two days, resting as much as possible. Light activity is best. You may usually return to work within a week. You should refrain from nose blowing, strenuous activity, or heavy lifting greater than 20lbs for a total of one week after your operation.  If sneezing cannot be avoided, sneeze with your mouth open. DISCOMFORT: You may experience a dull headache and pressure along with nasal congestion and discharge. These symptoms may be worse during the first week after the operation but may last as long as two to four weeks.  Please take Tylenol or the pain medication that has been prescribed for you. Do not take aspirin or aspirin containing medications since they may cause bleeding.  You may experience symptoms of post nasal drainage, nasal congestion, headaches and fatigue for two or three months after your operation.  BLEEDING: You may have some blood tinged nasal drainage for approximately two weeks after the operation.  The discharge will be worse for the first week.  Please call our office at [phone] or go to the nearest hospital emergency room if you experience any of the following: heavy, bright red blood from your nose or mouth that lasts longer than 15 minutes or coughing up or vomiting bright red blood or blood clots. GENERAL CONSIDERATIONS: A gauze dressing will be placed on your upper lip to absorb any drainage after the operation. You may need to change this several times a day.  If you do not have very much drainage, you may remove the dressing.  Remember that you may gently wipe your nose with a tissue and sniff in, but DO NOT blow your nose. Please keep all of your postoperative appointments.  Your final results after the operation will depend on proper follow-up.  The initial visit is usually 2 to 5 days after the operation.  During this visit, the remaining nasal  packing and internal septal splints will be removed.  Your nasal and sinus cavities will be cleaned.  During the second visit, your nasal and sinus cavities will be cleaned again. Have someone drive you to your first two postoperative appointments.  How you care for your nose after the operation will influence the results that you obtain.  You should follow all directions, take your medication as prescribed, and call our office [phone] with any problems or questions. You may be more comfortable sleeping with your head elevated on two pillows. Do not take any medications that we have not prescribed or recommended. WARNING SIGNS: if any of the following should occur, please call our office: Persistent fever greater than 102F. Persistent vomiting. Severe and constant pain that is not relieved by prescribed pain medication. Trauma to the nose. Rash or unusual side effects from any medicines.    Post Anesthesia Home Care Instructions  Activity: Get plenty of rest for the remainder of the day. A responsible individual must stay with you for 24 hours following the procedure.  For the next 24 hours, DO NOT: -Drive a car -Advertising copywriter -Drink alcoholic beverages -Take any medication unless instructed by your physician -Make any legal decisions or sign important papers.  Meals: Start with liquid foods such as gelatin or soup. Progress to regular foods as tolerated. Avoid greasy, spicy, heavy foods. If nausea and/or vomiting occur, drink only clear liquids until the nausea and/or vomiting subsides. Call your physician if vomiting continues.  Special Instructions/Symptoms: Your throat may feel dry  or sore from the anesthesia or the breathing tube placed in your throat during surgery. If this causes discomfort, gargle with warm salt water. The discomfort should disappear within 24 hours.  If you had a scopolamine patch placed behind your ear for the management of post- operative nausea  and/or vomiting:  1. The medication in the patch is effective for 72 hours, after which it should be removed.  Wrap patch in a tissue and discard in the trash. Wash hands thoroughly with soap and water. 2. You may remove the patch earlier than 72 hours if you experience unpleasant side effects which may include dry mouth, dizziness or visual disturbances. 3. Avoid touching the patch. Wash your hands with soap and water after contact with the patch.   No tylenol or ibuprofen until after 1:45 today if needed.

## 2023-07-12 NOTE — Anesthesia Postprocedure Evaluation (Signed)
Anesthesia Post Note  Patient: Annette Larsen  Procedure(s) Performed: ENDOSCOPIC SINUS SURGERY (Right: Nose) MAXILLARY ANTROSTOMY WITH TISSUE REMOVAL (Right: Nose) ORAL ANTRAL FISTULA REPAIR (Right: Mouth)     Patient location during evaluation: PACU Anesthesia Type: General Level of consciousness: awake and alert Pain management: pain level controlled Vital Signs Assessment: post-procedure vital signs reviewed and stable Respiratory status: spontaneous breathing, nonlabored ventilation, respiratory function stable and patient connected to nasal cannula oxygen Cardiovascular status: blood pressure returned to baseline and stable Postop Assessment: no apparent nausea or vomiting Anesthetic complications: no   No notable events documented.  Last Vitals:  Vitals:   07/12/23 1045 07/12/23 1115  BP:  121/68  Pulse: 73 70  Resp: 14 16  Temp:  (!) 36.2 C  SpO2: 96% 96%    Last Pain:  Vitals:   07/12/23 1115  TempSrc:   PainSc: 0-No pain                 Revia Nghiem

## 2023-07-12 NOTE — Op Note (Signed)
DATE OF PROCEDURE: 07/12/2023  OPERATIVE REPORT   SURGEON: Newman Pies, MD   PREOPERATIVE DIAGNOSES:  1.  Chronic right maxillary sinusitis and polyposis. 2.  Right oral maxillary fistula.  POSTOPERATIVE DIAGNOSES:  1.  Chronic right maxillary sinusitis. 2.  Right oral maxillary fistula.  PROCEDURE PERFORMED:  1.  Right oral maxillary fistula repair 2.  Right endoscopic maxillary antrostomy with tissue removal.  ANESTHESIA: General endotracheal tube anesthesia.   COMPLICATIONS: None.   ESTIMATED BLOOD LOSS: 50 mL.   INDICATION FOR PROCEDURE: Annette Larsen is a 53 y.o. female with a history of chronic right-sided sinusitis and oroantral fistula.  Her symptoms started in August 2023, after she underwent dental extraction of a right maxillary molar.  She complained of chronic right-sided facial pain, nasal congestion, and nasal drainage.  She was treated with multiple courses of antibiotics.  She subsequently underwent a sinus CT scan.  The CT showed opacification of the right maxillary sinus and a right oral antral fistula. Based on the above findings, the decision was made for the patient to undergo the above-stated procedures. The risks, benefits, alternatives, and details of the procedures were discussed with the patient. Questions were invited and answered. Informed consent was obtained.   DESCRIPTION OF PROCEDURE: The patient was taken to the operating room and placed supine on the operating table. General endotracheal tube anesthesia was administered by the anesthesiologist. The patient was positioned, and prepped and draped in the standard fashion for nasal surgery. Pledgets soaked with Afrin were placed in both nasal cavities for decongestion. The pledgets were subsequently removed.   Using a 0 endoscope, the right nasal cavity was examined. Polypoid tissue was noted within the right middle meatus. The polypoid tissue was removed using a combination of microdebrider and Blakesley  forceps. The uncinate process was resected with a freer elevator. The maxillary antrum was entered and enlarged using a combination of backbiter and microdebrider. Polypoid and inflamed tissue was removed from the left maxillary sinus.  The right maxillary sinus was copiously irrigated with saline solution.  Attention was then focused on the right oral maxillary fistula.  The fistula was probed with a lacrimal probe.  The edge of the fistula was then freshened with a rasper.  Parallel incisions were then made adjacent to the fistula.  Mucosal flaps around the fistula was then elevated and freed.  The fistula was closed with interrupted 4-0 Vicryl sutures.  The surgical site was copiously irrigated.  The care of the patient was turned over to the anesthesiologist. The patient was awakened from anesthesia without difficulty. The patient was extubated and transferred to the recovery room in good condition.   OPERATIVE FINDINGS: Chronic right maxillary sinusitis and polyposis.  Right oral maxillary fistula.  SPECIMEN: Right sinus content.   FOLLOWUP CARE: The patient be discharged home once she is awake and alert. The patient will be placed on amoxicillin 875 mg p.o. b.i.d. for 5 days. The patient will follow up in my office in 7 days.  Adyson Vanburen Philomena Doheny, MD

## 2023-07-12 NOTE — Transfer of Care (Signed)
Immediate Anesthesia Transfer of Care Note  Patient: STEFFANI BON  Procedure(s) Performed: ENDOSCOPIC SINUS SURGERY (Right: Nose) MAXILLARY ANTROSTOMY WITH TISSUE REMOVAL (Right: Nose) ORAL ANTRAL FISTULA REPAIR (Right: Mouth)  Patient Location: PACU  Anesthesia Type:General  Level of Consciousness: sedated  Airway & Oxygen Therapy: Patient Spontanous Breathing and Patient connected to face mask oxygen  Post-op Assessment: Report given to RN and Post -op Vital signs reviewed and stable  Post vital signs: Reviewed and stable  Last Vitals:  Vitals Value Taken Time  BP 102/71 07/12/23 1010  Temp    Pulse 68 07/12/23 1010  Resp 14 07/12/23 1010  SpO2 100 % 07/12/23 1010  Vitals shown include unfiled device data.  Last Pain:  Vitals:   07/12/23 0742  TempSrc: Oral  PainSc: 0-No pain      Patients Stated Pain Goal: 4 (07/12/23 0742)  Complications: No notable events documented.

## 2023-07-13 ENCOUNTER — Encounter (HOSPITAL_BASED_OUTPATIENT_CLINIC_OR_DEPARTMENT_OTHER): Payer: Self-pay | Admitting: Otolaryngology

## 2023-07-13 LAB — SURGICAL PATHOLOGY

## 2023-08-09 ENCOUNTER — Ambulatory Visit (INDEPENDENT_AMBULATORY_CARE_PROVIDER_SITE_OTHER): Payer: Commercial Managed Care - HMO | Admitting: Otolaryngology

## 2023-08-09 ENCOUNTER — Encounter (INDEPENDENT_AMBULATORY_CARE_PROVIDER_SITE_OTHER): Payer: Self-pay | Admitting: Otolaryngology

## 2023-08-09 VITALS — Ht 66.0 in | Wt 198.0 lb

## 2023-08-09 DIAGNOSIS — J320 Chronic maxillary sinusitis: Secondary | ICD-10-CM

## 2023-08-10 DIAGNOSIS — J320 Chronic maxillary sinusitis: Secondary | ICD-10-CM | POA: Insufficient documentation

## 2023-08-10 NOTE — Progress Notes (Signed)
Patient ID: Annette Larsen, female   DOB: 1970-12-29, 52 y.o.   MRN: [id number]  Follow-up: Chronic right maxillary sinusitis  Procedure: Nasal/sinus debridement status post endoscopic sinus surgery.  Indication: The patient previously underwent right endoscopic sinus surgery to treat her right chronic maxillary sinusitis.  The patient returns today reporting improvement in her right nasal congestion and crusting.  She had no difficulty bleeding from her right nasal cavity.  Currently the patient denies any facial pain, fever, or visual change.  Anesthesia: Topical Xylocaine and Neo-Synephrine.  Description: The patient is placed upright in the exam chair.   A 0 rigid endoscope is used for the examination. The scope is first advanced past the right nostril into the right nasal cavity. A small amount of crusting is noted within the right nasal cavity and the maxillary cavity.  The crusting is removed with suction catheters and alligator forceps, which are inserted in parallel with the rigid endoscope.  After the debridement procedure, the maxillary antrum is noted to be widely patent.  The patient tolerated the procedure well.  Follow up care: The patient is instructed to perform as needed nasal saline irrigation.  The patient will return for re-evaluation in approximately 6 months.

## 2023-10-08 IMAGING — MG MM DIGITAL SCREENING BILAT W/ TOMO AND CAD
8 series · 8 of 24 positions shown · non-contrast
Comparison: Previous exam(s).

CLINICAL DATA: Screening.

EXAM:
DIGITAL SCREENING BILATERAL MAMMOGRAM WITH TOMOSYNTHESIS AND CAD
TECHNIQUE: Bilateral screening digital craniocaudal and mediolateral oblique
mammograms were obtained. Bilateral screening digital breast
tomosynthesis was performed. The images were evaluated with
computer-aided detection.

[R CC synth-2D]
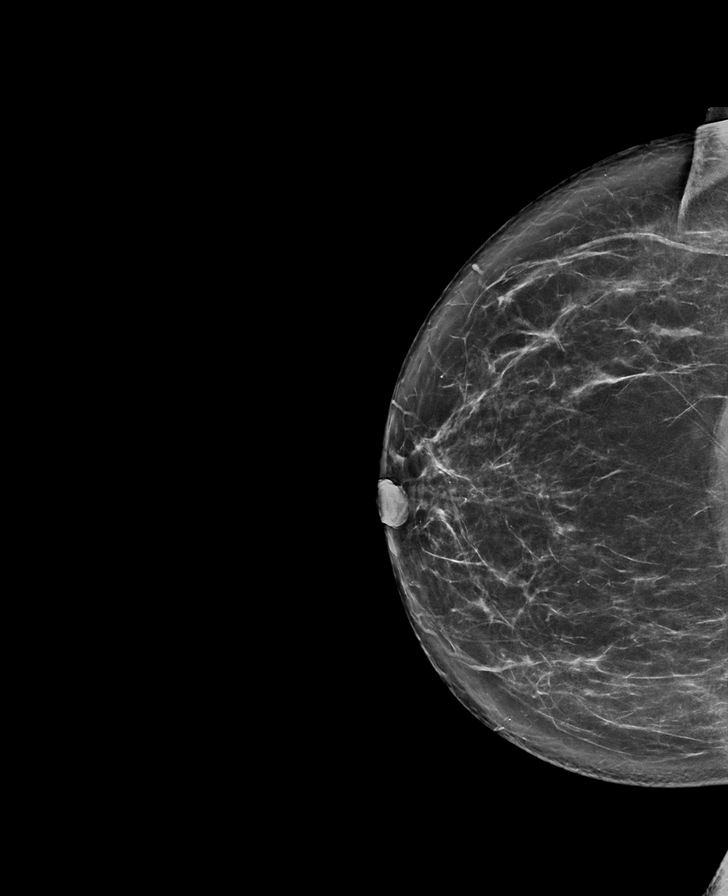

[L CC synth-2D]
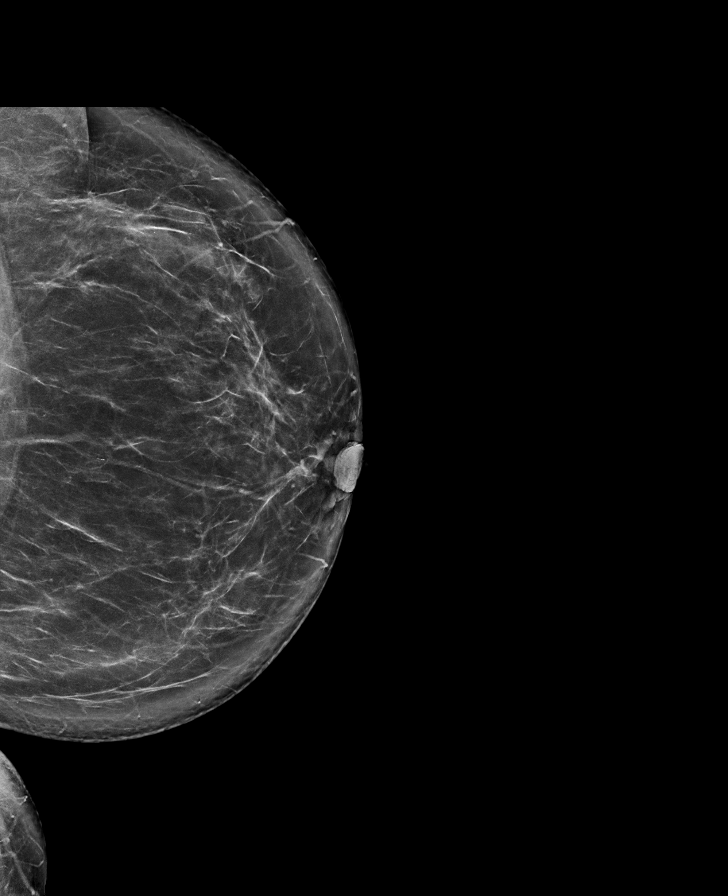

[R MLO synth-2D]
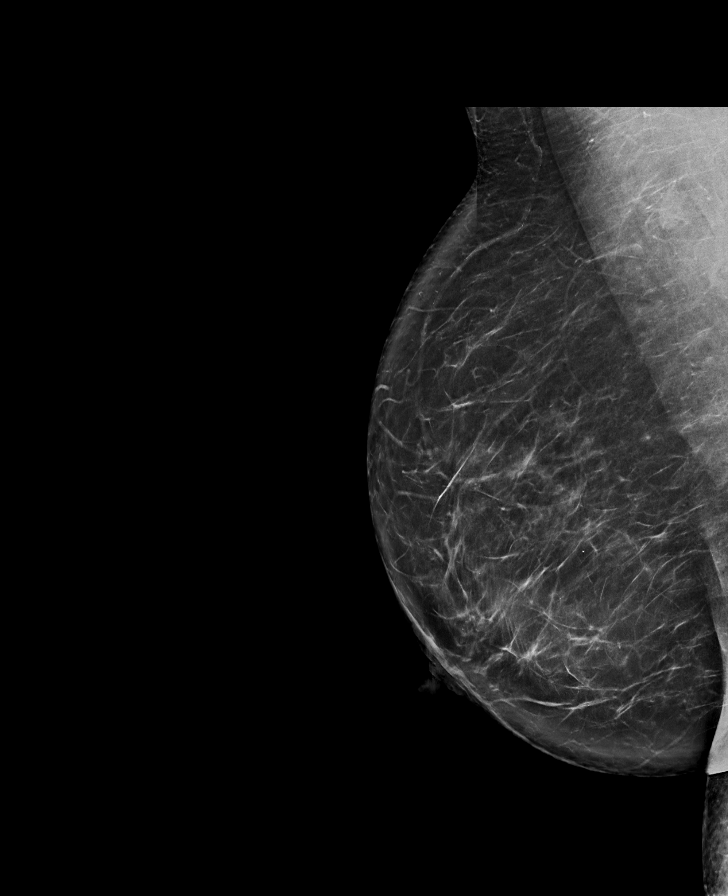

[L MLO synth-2D]
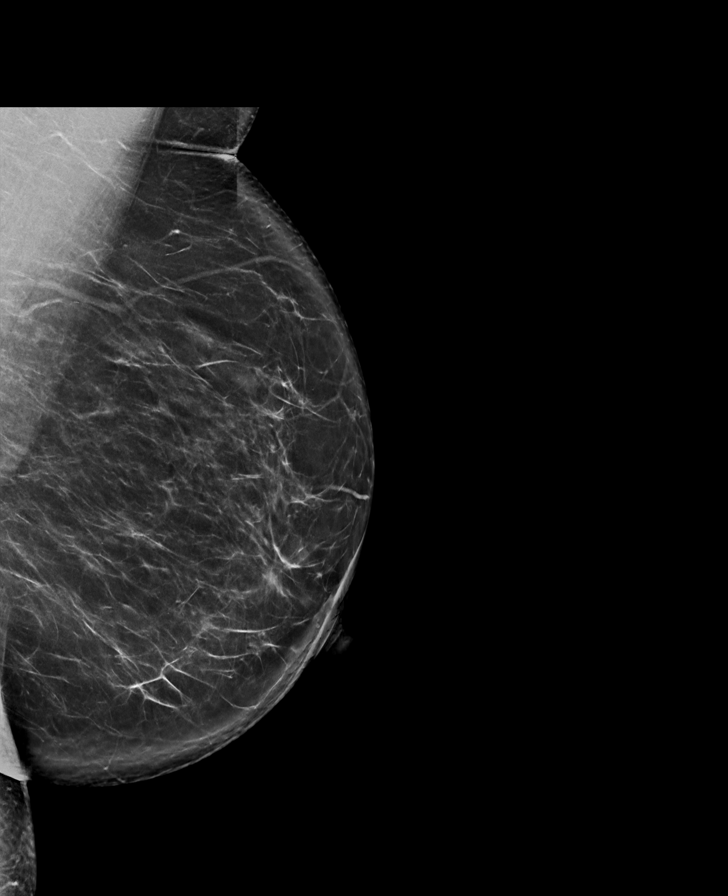

[R MLO tomo · tomo slice 47/94.0]
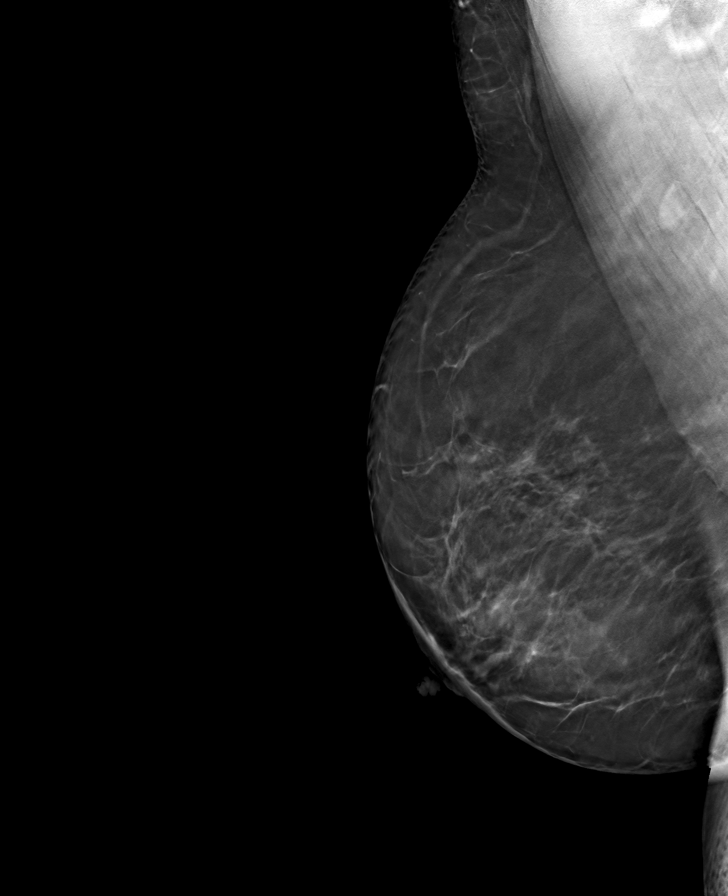

[L MLO tomo · tomo slice 47/92.0]
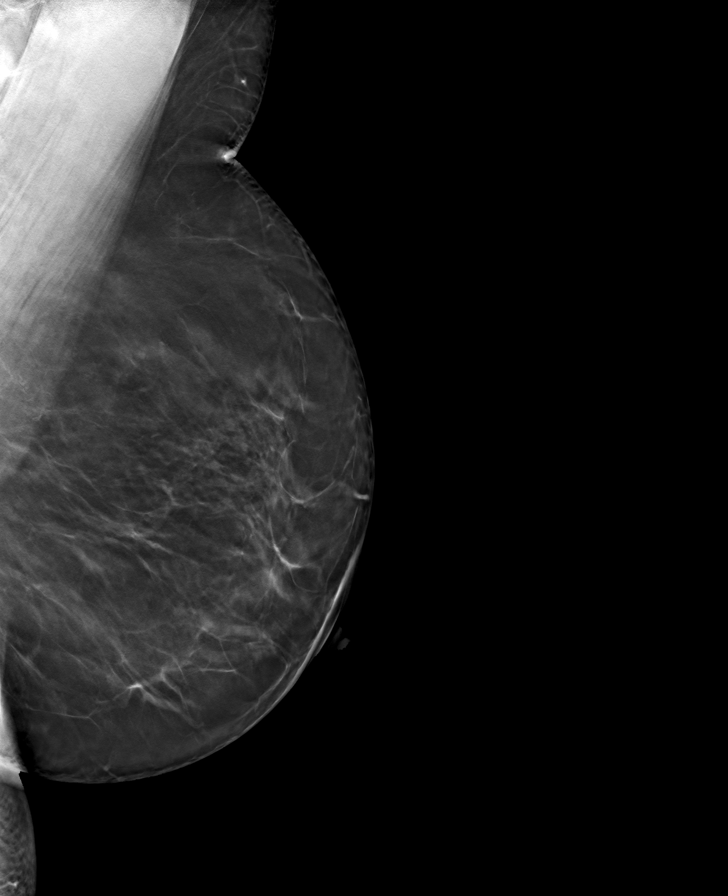

[L CC tomo · tomo slice 46/91.0]
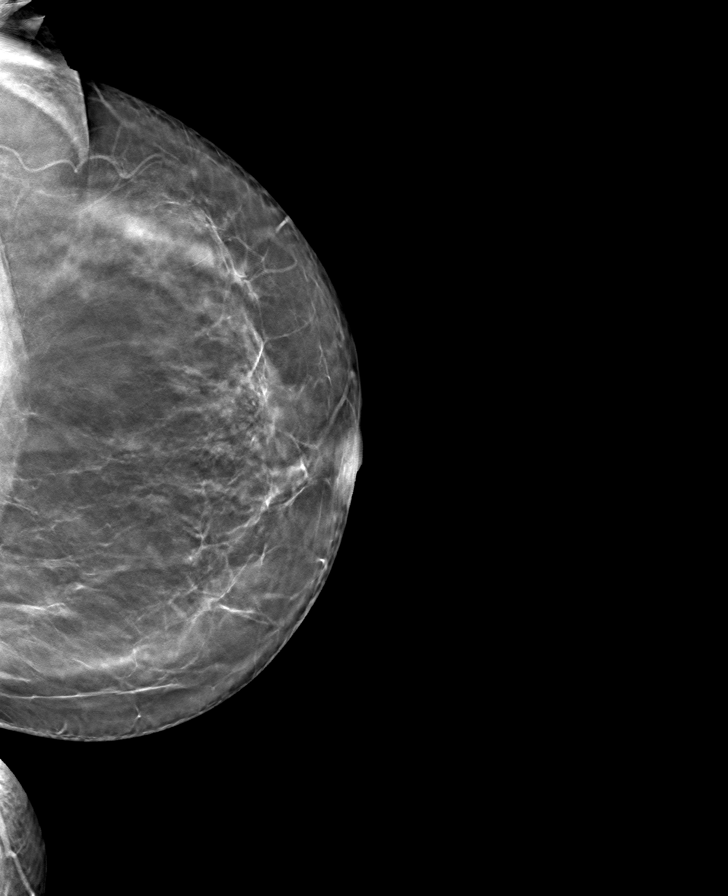

[R CC tomo · tomo slice 42/83.0]
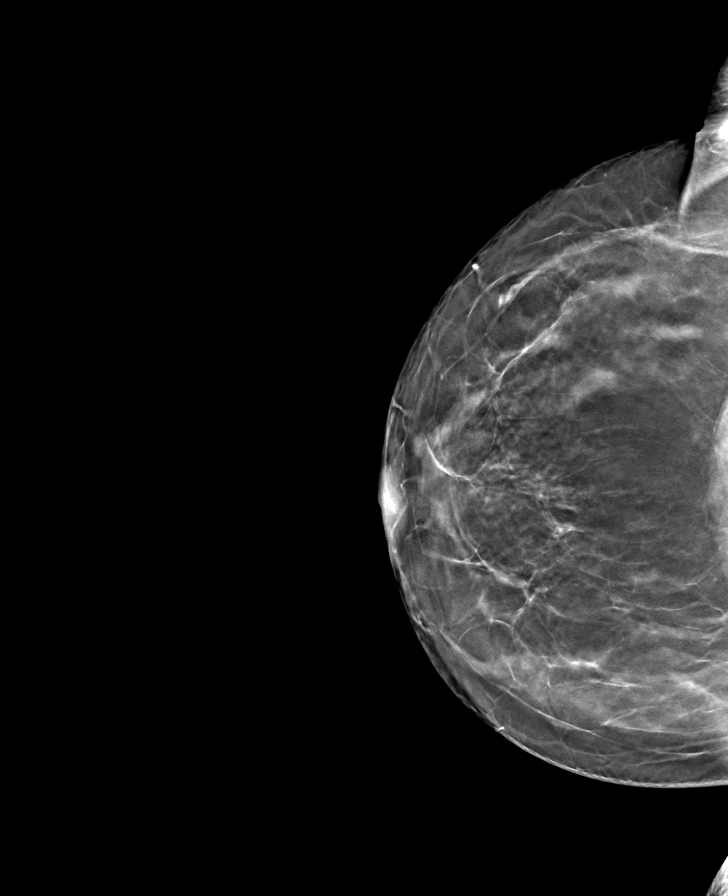

[8 of 24 positions shown; findings below may reference images not displayed]

ACR Breast Density Category b: There are scattered areas of
fibroglandular density.
FINDINGS: There are no findings suspicious for malignancy.
IMPRESSION: No mammographic evidence of malignancy. A result letter of this
screening mammogram will be mailed directly to the patient.

RECOMMENDATION:
Screening mammogram in one year. (Code:51-O-LD2)

BI-RADS CATEGORY  1: Negative.

## 2023-12-01 NOTE — Progress Notes (Signed)
GYNECOLOGY  VISIT   HPI: 53 y.o.   Single  African American female   G2P2 with Patient's last menstrual period was 10/27/2023 (approximate).   here for: hormonal issues-- trouble sleeping, narrower hips and anatomical changes.   Feels like she has change in her muscle structure and her vagina.  She notes that her labia and clitoris are smaller.   Some weight gain.   No hot flashes or night sweats.  Some vaginal dryness.   Hx endometrial ablation.  Has spotting every month, brownish or red in color and lasts for 3 days. Very light.   Not currently in a relationship but wants to be healthy and prepared.   GYNECOLOGIC HISTORY: Patient's last menstrual period was 10/27/2023 (approximate). Contraception:  ablation Menopausal hormone therapy:  n/a Last 2 paps:  12/16/21 neg: HR HPV neg, 02/28/20 neg: HR HPV neg History of abnormal Pap or positive HPV:  yes,  2008, 2011 LSIL:no treatment to cervix, only repeat paps which reverted to normal.    Mammogram:  04/29/23 Breast density Cat B, BI-RADS CAT 2 benign        OB History     Gravida  2   Para  2   Term      Preterm      AB      Living  2      SAB      IAB      Ectopic      Multiple      Live Births                 Patient Active Problem List   Diagnosis Date Noted   Chronic maxillary sinusitis 08/10/2023   Hemorrhoids 12/14/2016   Rectal bleeding    Folliculitis 03/24/2013   S/P endometrial ablation 07/20/2012   Cervical dysplasia 07/20/2012    Past Medical History:  Diagnosis Date   Acne    Acne    Benign cellular changes seen on Pap smear 04/02/2010   Condyloma 09/03/2007   Cyst of right Bartholin's gland 11/02/2013   IBS (irritable bowel syndrome)    LGSIL (low grade squamous intraepithelial dysplasia)    04/2007, 04/2009,     Past Surgical History:  Procedure Laterality Date   CLOSURE ORAL ANTRAL FISTULA Right 07/12/2023   Procedure: ORAL ANTRAL FISTULA REPAIR;  Surgeon: Newman Pies,  MD;  Location: [facility];  Service: ENT;  Laterality: Right;   ENDOMETRIAL ABLATION  2005   FLEXIBLE SIGMOIDOSCOPY  03/15/2008   MAXILLARY ANTROSTOMY Right 07/12/2023   Procedure: MAXILLARY ANTROSTOMY WITH TISSUE REMOVAL;  Surgeon: Newman Pies, MD;  Location: [facility];  Service: ENT;  Laterality: Right;   NASAL SINUS SURGERY Right 07/12/2023   Procedure: ENDOSCOPIC SINUS SURGERY;  Surgeon: Newman Pies, MD;  Location:  SURGERY CENTER;  Service: ENT;  Laterality: Right;   thrombosed hemorrhoid  12/2016   TOOTH EXTRACTION  2023   TUBAL LIGATION  1999    Current Outpatient Medications  Medication Sig Dispense Refill   betamethasone, augmented, (DIPROLENE) 0.05 % lotion Apply topically 2 (two) times daily.     Cholecalciferol (VITAMIN D3) 5000 UNITS CAPS Take by mouth.     clobetasol ointment (TEMOVATE) 0.05 % Apply topically.     Fluocinolone Acetonide Body 0.01 % OIL Apply 4-5 drops to affected areas on ears daily as needed.     fluocinonide (LIDEX) 0.05 % external solution   0   hydrocortisone 2.5 % cream  APPLY TOPICALLY TO FACE DAILY AS NEEDED     ketoconazole (NIZORAL) 2 % shampoo ketoconazole 2 % shampoo  apply as directed twice a day 2-3 TIMES A WEEK     LORazepam (ATIVAN) 0.5 MG tablet Take one tablet 90 minutes before MRI and can repeat dose 15 minutes prior to MRI if needed (Patient not taking: Reported on 12/15/2023)     No current facility-administered medications for this visit.     ALLERGIES: Cephalexin  Family History  Problem Relation Age of Onset   Diabetes Brother 63       died with complications from diabetes   Hypertension Father    Heart disease Father    Heart attack Father    Diabetes Mother    Migraines Mother     Social History   Socioeconomic History   Marital status: Single    Spouse name: Not on file   Number of children: 2   Years of education: Master's   Highest education level: Not on file  Occupational History    Occupation: [employer]  Tobacco Use   Smoking status: Never   Smokeless tobacco: Never  Vaping Use   Vaping status: Never Used  Substance and Sexual Activity   Alcohol use: No    Alcohol/week: 0.0 standard drinks of alcohol   Drug use: Never   Sexual activity: Not Currently    Partners: Male    Birth control/protection: Surgical    Comment: TUBAL LIGATION  Other Topics Concern   Not on file  Social History Narrative   Lives alone   Right-handed   Caffeine: sometimes 1 cup per day   Social Drivers of Health   Financial Resource Strain: Patient Declined (11/30/2023)   Received from [facility]   Overall Financial Resource Strain (CARDIA)    Difficulty of Paying Living Expenses: Patient declined  Food Insecurity: No Food Insecurity (11/30/2023)   Received [facility]   Hunger Vital Sign    Worried About Running Out of Food in the Last Year: Never true    Ran Out of Food in the Last Year: Never true  Transportation Needs: No Transportation Needs (11/30/2023)   Received from [facility] - Transportation    Lack of Transportation (Medical): No    Lack of Transportation (Non-Medical): No  Physical Activity: Unknown (11/30/2023)   Received from [facility]   Exercise Vital Sign    Days of Exercise per Week: Patient declined    Minutes of Exercise per Session: 50 min  Stress: No Stress Concern Present (11/30/2023)   Received from [facility] - Occupational Stress Questionnaire    Feeling of Stress : Not at all  Social Connections: Socially Integrated (11/30/2023)   [facility]   Social Network    How would you rate your social network (family, work, friends)?: Good participation with social networks  Intimate Partner Violence: Not At Risk (11/30/2023)   Received from [facility]   HITS    Over the last 12 months how often did your partner physically hurt you?: Never    Over the last 12 months  how often did your partner insult you or talk down to you?: Never    Over the last 12 months how often did your partner threaten you with physical harm?: Never    Over the last 12 months how often did your partner scream or curse at you?: Never    Review of  Systems  All other systems reviewed and are negative.   PHYSICAL EXAMINATION:   BP 126/72 (BP Location: Left Arm, Patient Position: Sitting, Cuff Size: Small)   Pulse 71   Wt 198 lb (89.8 kg)   LMP 10/27/2023 (Approximate)   SpO2 97%   BMI 31.96 kg/m     General appearance: alert, cooperative and appears stated age  Pelvic: External genitalia:  no lesions              Urethra:  normal appearing urethra with no masses, tenderness or lesions              Bartholins and Skenes: normal                 Vagina: normal appearing vagina with normal color and discharge, no lesions              Cervix: no lesions                Bimanual Exam:  Uterus:  normal size, contour, position, consistency, mobility, non-tender              Adnexa: no mass, fullness, tenderness         Chaperone was present for exam:  Warren Lacy, CMA  ASSESSMENT:  Possible perimenopause.  Status post endometrial ablation.  Irregular menses.  Vaginal dryness.   PLAN:  We talked about the transition to menopause and changes in the body:  reproductive organs, cardiovascular health, and bone health.  Reassurance given regarding her labia and clitoral anatomy.  Will check FSH, estradiol, and testosterone levels. We discussed treatments for menopause:  HRT, Gabapentin, SSRI/SNRI, Veozah for vasomotor symptoms.  Treatments for vaginal dryness if needed:  water based lubricants, cooking oils, vaginal estrogens.  We discussed testosterone for decreased libido and that this is not an FDA approved medication for this but that it is documented in the literature for this purpose.  She understands there can be side effects and that monitoring of levels is important to  maintain a normal female range.  No current treatment initiated today.  Healthy lifestyle encouraged:  healthy diet and regular exercise.  Follow up for annual exam and prn.   30 min  total time was spent for this patient encounter, including preparation, face-to-face counseling with the patient, coordination of care, and documentation of the encounter.

## 2023-12-15 ENCOUNTER — Ambulatory Visit: Payer: BC Managed Care – PPO | Admitting: Obstetrics and Gynecology

## 2023-12-15 ENCOUNTER — Encounter: Payer: Self-pay | Admitting: Obstetrics and Gynecology

## 2023-12-15 VITALS — BP 126/72 | HR 71 | Wt 198.0 lb

## 2023-12-15 DIAGNOSIS — N898 Other specified noninflammatory disorders of vagina: Secondary | ICD-10-CM

## 2023-12-15 DIAGNOSIS — R5383 Other fatigue: Secondary | ICD-10-CM | POA: Diagnosis not present

## 2023-12-15 DIAGNOSIS — N926 Irregular menstruation, unspecified: Secondary | ICD-10-CM

## 2023-12-15 DIAGNOSIS — E559 Vitamin D deficiency, unspecified: Secondary | ICD-10-CM | POA: Diagnosis not present

## 2023-12-16 ENCOUNTER — Encounter: Payer: Self-pay | Admitting: Obstetrics and Gynecology

## 2023-12-21 LAB — ESTRADIOL: Estradiol: 48 pg/mL

## 2023-12-21 LAB — TESTOS,TOTAL,FREE AND SHBG (FEMALE)
Free Testosterone: 3.4 pg/mL (ref 0.1–6.4)
Sex Hormone Binding: 24.6 nmol/L (ref 17–124)
Testosterone, Total, LC-MS-MS: 22 ng/dL (ref 2–45)

## 2023-12-21 LAB — FOLLICLE STIMULATING HORMONE: FSH: 10.5 m[IU]/mL

## 2024-02-07 ENCOUNTER — Encounter (INDEPENDENT_AMBULATORY_CARE_PROVIDER_SITE_OTHER): Payer: Self-pay

## 2024-02-07 ENCOUNTER — Ambulatory Visit (INDEPENDENT_AMBULATORY_CARE_PROVIDER_SITE_OTHER): Payer: Commercial Managed Care - HMO

## 2024-02-07 VITALS — BP 143/89 | HR 79 | Ht 66.0 in | Wt 198.0 lb

## 2024-02-07 DIAGNOSIS — J320 Chronic maxillary sinusitis: Secondary | ICD-10-CM

## 2024-02-07 NOTE — Progress Notes (Unsigned)
 Patient ID: Annette Larsen, female   DOB: Oct 26, 1971, 53 y.o.   MRN: [id number]  Follow-up: Chronic right maxillary sinusitis  HPI: The patient is a 53 year old female who returns today for her follow-up evaluation.  The patient was previously seen for chronic right maxillary sinusitis and right oral antral fistula.  She underwent surgical repair of her fistula and right endoscopic sinus surgery in September 2024.  The patient returns today reporting no significant difficulty since the surgery.  She denies any fistula recurrence or recent sinusitis.  She still has occasional right facial tenderness.  She is able to breathe through both nostrils.  She has no fever or nasal drainage.  Exam: General: Communicates without difficulty, well nourished, no acute distress. Head: Normocephalic, no evidence injury, no tenderness, facial buttresses intact without stepoff. Face/sinus: No tenderness to palpation and percussion. Facial movement is normal and symmetric. Eyes: PERRL, EOMI. No scleral icterus, conjunctivae clear. Neuro: CN II exam reveals vision grossly intact.  No nystagmus at any point of gaze. Ears: Auricles well formed without lesions.  Ear canals are intact without mass or lesion.  No erythema or edema is appreciated.  The TMs are intact without fluid. Nose: External evaluation reveals normal support and skin without lesions.  Dorsum is intact.  Anterior rhinoscopy reveals congested mucosa over anterior aspect of inferior turbinates and intact septum.  No purulence noted. Oral:  Oral cavity and oropharynx are intact, symmetric, without erythema or edema.  Mucosa is moist without lesions. Neck: Full range of motion without pain.  There is no significant lymphadenopathy.  No masses palpable.  Thyroid bed within normal limits to palpation.  Parotid glands and submandibular glands equal bilaterally without mass.  Trachea is midline. Neuro:  CN 2-12 grossly intact.   Assessment: 1.  Her chronic right  maxillary sinusitis is under control status post right endoscopic sinus surgery. 2.  No recurrent polyposis or infection is noted today. 3.  No recurrent oral antral fistula is noted today.  Plan: 1.  The physical exam findings are reviewed with the patient. 2.  Flonase nasal spray and nasal saline irrigation as needed. 3.  The patient will return for reevaluation in 1 year, sooner if needed.

## 2024-03-06 ENCOUNTER — Ambulatory Visit (INDEPENDENT_AMBULATORY_CARE_PROVIDER_SITE_OTHER): Payer: Commercial Managed Care - HMO | Admitting: Obstetrics and Gynecology

## 2024-03-06 ENCOUNTER — Telehealth: Payer: Self-pay | Admitting: Obstetrics and Gynecology

## 2024-03-06 ENCOUNTER — Encounter: Payer: Self-pay | Admitting: Obstetrics and Gynecology

## 2024-03-06 VITALS — BP 128/98 | HR 66 | Ht 66.5 in | Wt 196.0 lb

## 2024-03-06 DIAGNOSIS — Z1331 Encounter for screening for depression: Secondary | ICD-10-CM | POA: Diagnosis not present

## 2024-03-06 DIAGNOSIS — R030 Elevated blood-pressure reading, without diagnosis of hypertension: Secondary | ICD-10-CM | POA: Diagnosis not present

## 2024-03-06 DIAGNOSIS — Z01419 Encounter for gynecological examination (general) (routine) without abnormal findings: Secondary | ICD-10-CM

## 2024-03-06 DIAGNOSIS — R21 Rash and other nonspecific skin eruption: Secondary | ICD-10-CM | POA: Diagnosis not present

## 2024-03-06 MED ORDER — SCOPOLAMINE 1 MG/3DAYS TD PT72: 1.0000 | MEDICATED_PATCH | TRANSDERMAL | 0 refills | Status: AC

## 2024-03-06 NOTE — Progress Notes (Signed)
 53 y.o. G2P2 Single African American female here for annual exam.    Notes some spotting every month.   No hot flashes.   Hormone testing 12/15/23:  Normal testosterone , FSH 10.5.   Not sexually actiive.    Going on a cruise next week.  Never cruised before.   Needs a referral to dermatologist, Dr. Myrtie Atkinson, at [facility].    PCP: Emaline Handsome, MD   No LMP recorded. Patient has had an ablation.           Sexually active: No.  The current method of family planning is tubal ligation.    Menopausal hormone therapy:  n/a Exercising: Yes.     Walking Smoker:  no  OB History  Gravida Para Term Preterm AB Living  2 2    2   SAB IAB Ectopic Multiple Live Births          # Outcome Date GA Lbr Len/2nd Weight Sex Type Anes PTL Lv  2 Para           1 Para              HEALTH MAINTENANCE: Last 2 paps:  12/16/21 neg HR HPV neg, 02/28/20 neg HR HPV neg  History of abnormal Pap or positive HPV:  no Mammogram:   04/21/23 Breast density Cat B BIRADS Cat 0 incomplete, 04/29/23.  Breast Density Cat B, BIRADS Cat 2 Benign  Colonoscopy:  May, 2024.  Dr. Honey Lusty.  Due  Bone Density:  n/a  Result  n/a   Immunization History  Administered Date(s) Administered   Hepatitis B, ADULT 08/12/2011, 09/14/2011, 02/11/2012   Hepatitis B, PED/ADOLESCENT 08/12/2011, 09/14/2011, 02/11/2012   Influenza,inj,quad, With Preservative 08/02/2017   Influenza-Unspecified 08/02/2017   MMR 09/14/2011   PFIZER(Purple Top)SARS-COV-2 Vaccination 03/25/2020, 04/15/2020   Tdap 08/12/2011, 12/16/2021   Varicella 08/21/2011, 09/14/2011      reports that she has never smoked. She has never used smokeless tobacco. She reports that she does not drink alcohol and does not use drugs.  Past Medical History:  Diagnosis Date   Acne    Acne    Benign cellular changes seen on Pap smear 04/02/2010   Condyloma 09/03/2007   Cyst of right Bartholin's gland 11/02/2013   IBS (irritable bowel syndrome)    LGSIL (low  grade squamous intraepithelial dysplasia)    04/2007, 04/2009,     Past Surgical History:  Procedure Laterality Date   CLOSURE ORAL ANTRAL FISTULA Right 07/12/2023   Procedure: ORAL ANTRAL FISTULA REPAIR;  Surgeon: Reynold Caves, MD;  Location: [facility];  Service: ENT;  Laterality: Right;   ENDOMETRIAL ABLATION  2005   FLEXIBLE SIGMOIDOSCOPY  03/15/2008   MAXILLARY ANTROSTOMY Right 07/12/2023   Procedure: MAXILLARY ANTROSTOMY WITH TISSUE REMOVAL;  Surgeon: Reynold Caves, MD;  Location: [facility];  Service: ENT;  Laterality: Right;   NASAL SINUS SURGERY Right 07/12/2023   Procedure: ENDOSCOPIC SINUS SURGERY;  Surgeon: Reynold Caves, MD;  Location: [facility];  Service: ENT;  Laterality: Right;   thrombosed hemorrhoid  12/2016   TOOTH EXTRACTION  2023   TUBAL LIGATION  1999    Current Outpatient Medications  Medication Sig Dispense Refill   Cholecalciferol (VITAMIN D3) 5000 UNITS CAPS Take by mouth.     clobetasol ointment (TEMOVATE) 0.05 % Apply topically.     hydrocortisone  2.5 % cream APPLY TOPICALLY TO FACE DAILY AS NEEDED     betamethasone , augmented, (DIPROLENE ) 0.05 % lotion Apply  topically 2 (two) times daily. (Patient not taking: Reported on 03/06/2024)     Fluocinolone Acetonide Body 0.01 % OIL Apply 4-5 drops to affected areas on ears daily as needed. (Patient not taking: Reported on 03/06/2024)     fluocinonide (LIDEX) 0.05 % external solution  (Patient not taking: Reported on 03/06/2024)  0   ketoconazole (NIZORAL) 2 % shampoo ketoconazole 2 % shampoo  apply as directed twice a day 2-3 TIMES A WEEK (Patient not taking: Reported on 03/06/2024)     LORazepam  (ATIVAN ) 0.5 MG tablet Take one tablet 90 minutes before MRI and can repeat dose 15 minutes prior to MRI if needed (Patient not taking: Reported on 12/15/2023)     No current facility-administered medications for this visit.    ALLERGIES: Cephalexin   Family History  Problem Relation Age of Onset    Diabetes Brother 64       died with complications from diabetes   Hypertension Father    Heart disease Father    Heart attack Father    Diabetes Mother    Migraines Mother     Review of Systems  All other systems reviewed and are negative.   PHYSICAL EXAM:  BP (!) 128/98 (BP Location: Right Arm, Patient Position: Sitting, Cuff Size: Normal)   Pulse 66   Ht 5' 6.5" (1.689 m)   Wt 196 lb (88.9 kg)   SpO2 98%   BMI 31.16 kg/m     General appearance: alert, cooperative and appears stated age Head: normocephalic, without obvious abnormality, atraumatic Neck: no adenopathy, supple, symmetrical, trachea midline and thyroid normal to inspection and palpation Lungs: clear to auscultation bilaterally Breasts: normal appearance, no masses or tenderness, No nipple retraction or dimpling, No nipple discharge or bleeding, No axillary adenopathy Heart: regular rate and rhythm Abdomen: soft, non-tender; no masses, no organomegaly Extremities: extremities normal, atraumatic, no cyanosis or edema Skin: skin color, texture, turgor normal.  Rash on midline chest.  Lymph nodes: cervical, supraclavicular, and axillary nodes normal. Neurologic: grossly normal  Pelvic: External genitalia:  no lesions              No abnormal inguinal nodes palpated.              Urethra:  normal appearing urethra with no masses, tenderness or lesions              Bartholins and Skenes: normal                 Vagina: normal appearing vagina with normal color and discharge, no lesions              Cervix: no lesions              Pap taken: No. Bimanual Exam:  Uterus:  normal size, contour, position, consistency, mobility, non-tender              Adnexa: no mass, fullness, tenderness              Rectal exam: Yes.  .  Confirms.              Anus:  normal sphincter tone, no lesions  Chaperone was present for exam:   Cottie Diss, CMA  ASSESSMENT: Well woman visit with gynecologic exam. History of LGSIL with no  treatment and normal follow up.  Status post BTL.   Status post endometrial ablation.   History of HSV 1. Hx IBS.    PHQ-9: 0 Skin rash.  Elevated blood pressure  readings.   PLAN: Mammogram screening discussed. Self breast awareness reviewed. Pap and HRV collected:  no Guidelines for Calcium, Vitamin D , regular exercise program including cardiovascular and weight bearing exercise. Medication refills:  NA Rx for Scopolamine for potential motion sickness.  Instructed in used and side effects of dizziness and sleepiness reviewed.  Do not mix with alcohol.  Labs with PCP.  Follow up:  yearly and prn.   Addendum:  Will have office triage reach out to patient with my recommendation for her to follow up with her PCP regarding her elevated blood pressure readings.

## 2024-03-06 NOTE — Patient Instructions (Signed)

## 2024-03-06 NOTE — Telephone Encounter (Signed)
 Please have patient follow up with her PCP regarding her increased blood pressure at her office visit today.   We did not discuss this at her office visit.

## 2024-03-07 NOTE — Telephone Encounter (Signed)
Spoke with patient, advised per Dr. Silva. Patient verbalizes understanding and is agreeable.  Encounter closed.  

## 2024-05-29 ENCOUNTER — Other Ambulatory Visit: Payer: Self-pay | Admitting: Obstetrics and Gynecology

## 2024-05-29 DIAGNOSIS — Z1231 Encounter for screening mammogram for malignant neoplasm of breast: Secondary | ICD-10-CM

## 2024-06-13 ENCOUNTER — Encounter: Payer: Self-pay | Admitting: Dermatology

## 2024-06-13 ENCOUNTER — Ambulatory Visit: Payer: Commercial Managed Care - HMO | Admitting: Dermatology

## 2024-06-13 VITALS — BP 138/90 | Wt 194.0 lb

## 2024-06-13 DIAGNOSIS — L409 Psoriasis, unspecified: Secondary | ICD-10-CM

## 2024-06-13 DIAGNOSIS — L299 Pruritus, unspecified: Secondary | ICD-10-CM | POA: Diagnosis not present

## 2024-06-13 MED ORDER — ZORYVE 0.3 % EX FOAM: 1.0000 | Freq: Every day | CUTANEOUS | 4 refills | Status: AC | PRN

## 2024-06-13 NOTE — Patient Instructions (Addendum)
 Date: Tue Jun 13 2024  Hello Charlott Gains,  Thank you for visiting today. Here is a summary of the key instructions:  - Medications:   - Use Zoryve  foam on affected areas of scalp, face, and chest   - Shake bottle, squeeze a little into cap, and rub into affected areas   - Use daily until clear, then 2-3 times a week to prevent flares   - Start Taltz for psoriasis and joint pain   - Start Zepbound for weight management  - Treatment Areas:   - May take 4-8 weeks to see improvements with Zerit foam  - Follow-up:   - Return for follow-up appointment in 3 months   - Complete lab work to screen for TB and hepatitis before starting new medications   - Contact the office if the pharmacy says Zoryve  foam is expensive  - Other Instructions:   - Zoryve  foam will be sent to [facility]   - [facility] will contact you about medication delivery   - Get a flu shot, especially if using biologic medications  Please reach out if you have any questions or concerns.  Warm regards,  Dr. [facility]  Important Information  Due to recent changes in healthcare laws, you may see results of your pathology and/or laboratory studies on MyChart before the doctors have had a chance to review them. We understand that in some cases there may be results that are confusing or concerning to you. Please understand that not all results are received at the same time and often the doctors may need to interpret multiple results in order to provide you with the best plan of care or course of treatment. Therefore, we ask that you please give us  2 business days to thoroughly review all your results before contacting the office for clarification. Should we see a critical lab result, you will be contacted sooner.   If You Need Anything After Your Visit  If you have any questions or concerns for your doctor, please call our main line at [phone] If no one answers, please leave a  voicemail as directed and we will return your call as soon as possible. Messages left after 4 pm will be answered the following business day.   You may also send us  a message via MyChart. We typically respond to MyChart messages within 1-2 business days.  For prescription refills, please ask your pharmacy to contact our office. Our fax number is [phone].  If you have an urgent issue when the clinic is closed that cannot wait until the next business day, you can page your doctor at the number below.    Please note that while we do our best to be available for urgent issues outside of office hours, we are not available 24/7.   If you have an urgent issue and are unable to reach us , you may choose to seek medical care at your doctor's office, retail clinic, urgent care center, or emergency room.  If you have a medical emergency, please immediately call 911 or go to the emergency department. In the event of inclement weather, please call our main line at [phone] for an update on the status of any delays or closures.  Dermatology Medication Tips: Please keep the boxes that topical medications come in in order to help keep track of the instructions about where and how to use these. Pharmacies typically print the medication instructions only on the boxes and not directly on the  medication tubes.   If your medication is too expensive, please contact our office at [phone] or send us  a message through MyChart.   We are unable to tell what your co-pay for medications will be in advance as this is different depending on your insurance coverage. However, we may be able to find a substitute medication at lower cost or fill out paperwork to get insurance to cover a needed medication.   If a prior authorization is required to get your medication covered by your insurance company, please allow us  1-2 business days to complete this process.  Drug prices often vary depending on where the  prescription is filled and some pharmacies may offer cheaper prices.  The website www.goodrx.com contains coupons for medications through different pharmacies. The prices here do not account for what the cost may be with help from insurance (it may be cheaper with your insurance), but the website can give you the price if you did not use any insurance.  - You can print the associated coupon and take it with your prescription to the pharmacy.  - You may also stop by our office during regular business hours and pick up a GoodRx coupon card.  - If you need your prescription sent electronically to a different pharmacy, notify our office through [facility] or by phone at [phone]

## 2024-06-13 NOTE — Progress Notes (Signed)
 New Patient Visit   Subjective  Annette Larsen is a 53 y.o. female who presents for a NEW PATIENT appointment to be examined for the concerns as listed below.   Dermatitis: Patient is having some itching and flaking at the scalp, ears and cleavage area that presented 5 years ago that has increasing gotten worse over the last 62mo. She rates the itching ranges between 3-5 out of 10. She has been treated previously by different dermatologist who worked her up for atopic derm/eczema & Seb Derm but she believes she may possibly have psoriasis due to having joint pain each the morning since this started 5 years ago. Patient noted that her father had a Dx of psoriasis that was scattered across the body. He has been Rx fluocinolone oil &  betamethasone  - pt stated the medications were helping mildly but would like a better regimen.    Patient reports Hx of Bx - benign. Patient denied family Hx of skin cancer.   The following portions of the chart were reviewed this encounter and updated as appropriate: medications, allergies, medical history  Review of Systems:  No other skin or systemic complaints except as noted in HPI or Assessment and Plan.  Objective  Well appearing patient in no apparent distress; mood and affect are within normal limits.   A focused examination was performed of the following areas: scalp, ears & chest   Relevant exam findings are noted in the Assessment and Plan.               Assessment & Plan   PSORIASIS and Puritus Exam: Well-demarcated erythematous papules/plaques with silvery scale, guttate pink scaly papules. 10% BSA. IGA score 2  flared  patient reports joint pain  Psoriasis is a chronic non-curable, but treatable genetic/hereditary disease that may have other systemic features affecting other organ systems such as joints (Psoriatic Arthritis). It is associated with an increased risk of inflammatory bowel disease, heart disease, non-alcoholic  fatty liver disease, and depression.  Treatments include light and laser treatments; topical medications; and systemic medications including oral and injectables.  Pt is not a candidate for methotrexate or cyclosporine due to inability for follow up labs and contraindications with other medications. Pt has no access to a light box for phototherapy.  Due to the progressive and chronic nature of her psoriasis, patient has tried and failed numerous topicals creams as noted above, the next best therapeutic option is a systemic therapy. It is medically necessary to help improve her quality of life.    - Assessment: Patient presents with chronic scaliness and itchiness in the ear, scalp, and between the breasts. Previous treatment with betamethasone  provided temporary relief but symptoms recurred. Family history of psoriasis is noted. Physical examination reveals psoriasis plaques on the scalp, face, and chest. The scalp and ears are more affected than the body, with some involvement between the breasts. The condition is differentiated from seborrheic dermatitis based on the appearance of the plaques. - Plan:    Prescribe Zoryve  foam for topical application    Apply to affected areas 2-3 times a week in chronic areas to prevent flares    Expect improvements in 4-8 weeks    Educate patient on proper application technique: shake bottle, invert, squeeze into cap, and rub into affected areas    Send prescription to [facility] for fulfillment and mail delivery  2. Suspected Psoriatic Arthritis - Assessment: Patient reports joint pain, particularly in the mornings and after extensive exercise. This  presentation is consistent with psoriatic arthritis, which often co-occurs with psoriasis. The timing of joint pain is classic for this condition. Given the potential for joint damage, early systemic treatment is warranted. - Plan:    Order laboratory tests to screen for TB and hepatitis prior to initiating  systemic therapy    Discuss and initiate Taltz (ixekizumab) for joint and skin symptoms     Recommend flu vaccination, especially if starting biologic therapy  Follow-up in 3 months to assess response to treatment and discuss potential systemic treatments.  PSORIASIS   Related Medications Roflumilast  (ZORYVE ) 0.3 % FOAM Apply 1 Application topically daily as needed. Apply to face, ears & scalp.  Return for PSORIASIS, TBSE.   Documentation: I have reviewed the above documentation for accuracy and completeness, and I agree with the above.  I, Shirron Maranda, CMA, am acting as scribe for [employer], DO.   Delon Lenis, DO

## 2024-06-20 ENCOUNTER — Ambulatory Visit
Admission: RE | Admit: 2024-06-20 | Discharge: 2024-06-20 | Disposition: A | Discharge status: Home / Self Care | Source: Ambulatory Visit | Attending: Obstetrics and Gynecology | Admitting: Obstetrics and Gynecology

## 2024-06-20 DIAGNOSIS — Z1231 Encounter for screening mammogram for malignant neoplasm of breast: Secondary | ICD-10-CM | POA: Diagnosis not present

## 2024-06-24 ENCOUNTER — Ambulatory Visit: Payer: Self-pay | Admitting: Obstetrics and Gynecology

## 2024-08-09 ENCOUNTER — Encounter: Payer: Self-pay | Admitting: Dermatology

## 2024-08-10 NOTE — Telephone Encounter (Signed)
 Hi Brooke,  Can you please message the patient and let her know that once I receive her labs I will send the rx for taltz (I can't send it before then) it will take 2-3 weeks for the apporval and then once she's approved she can enroll in the program to get Zepbound.  Thanks

## 2024-08-11 ENCOUNTER — Other Ambulatory Visit: Payer: Self-pay

## 2024-08-11 DIAGNOSIS — L409 Psoriasis, unspecified: Secondary | ICD-10-CM

## 2024-08-11 MED ORDER — TALTZ 80 MG/ML ~~LOC~~ SOAJ: 80.0000 mg | SUBCUTANEOUS | 11 refills | Status: AC

## 2024-08-11 MED ORDER — TALTZ 80 MG/ML ~~LOC~~ SOAJ: 160.0000 mg | SUBCUTANEOUS | 0 refills | Status: AC

## 2024-08-11 MED ORDER — TALTZ 80 MG/ML ~~LOC~~ SOAJ: 80.0000 mg | Freq: Once | SUBCUTANEOUS | 0 refills | Status: AC

## 2024-08-11 MED ORDER — TALTZ 80 MG/ML ~~LOC~~ SOAJ: 80.0000 mg | SUBCUTANEOUS | 1 refills | Status: AC

## 2024-09-04 DIAGNOSIS — L409 Psoriasis, unspecified: Secondary | ICD-10-CM | POA: Diagnosis not present

## 2024-09-06 LAB — ACUTE HEP PANEL AND HEP B SURFACE AB
Hep A IgM: NEGATIVE
Hep B C IgM: NEGATIVE
Hep C Virus Ab: NONREACTIVE
Hepatitis B Surf Ab Quant: <3.5 m[IU]/mL — ABNORMAL LOW
Hepatitis B Surface Ag: NEGATIVE

## 2024-09-06 LAB — QUANTIFERON-TB GOLD PLUS
QuantiFERON Mitogen Value: >10 [IU]/mL
QuantiFERON Nil Value: 0.03 [IU]/mL
QuantiFERON TB1 Ag Value: 0.02 [IU]/mL
QuantiFERON TB2 Ag Value: 0.03 [IU]/mL
QuantiFERON-TB Gold Plus: NEGATIVE

## 2024-09-07 ENCOUNTER — Ambulatory Visit: Payer: Self-pay | Admitting: Dermatology

## 2024-09-07 NOTE — Telephone Encounter (Signed)
 I just received pt's lab results.  I send a results message to you for sending her rx for Taltz.  Thanks!

## 2024-09-07 NOTE — Progress Notes (Signed)
 Hi Shirron,  I received pt's labs.  Everything was WNL so you can send her RX for Taltz for treating psoriasis.  Thanks!

## 2024-09-13 ENCOUNTER — Ambulatory Visit: Admitting: Dermatology

## 2024-09-13 ENCOUNTER — Encounter: Payer: Self-pay | Admitting: Dermatology

## 2024-09-13 VITALS — BP 133/87 | HR 70 | Wt 198.0 lb

## 2024-09-13 DIAGNOSIS — D1801 Hemangioma of skin and subcutaneous tissue: Secondary | ICD-10-CM | POA: Diagnosis not present

## 2024-09-13 DIAGNOSIS — R5383 Other fatigue: Secondary | ICD-10-CM | POA: Insufficient documentation

## 2024-09-13 DIAGNOSIS — L659 Nonscarring hair loss, unspecified: Secondary | ICD-10-CM | POA: Insufficient documentation

## 2024-09-13 DIAGNOSIS — L821 Other seborrheic keratosis: Secondary | ICD-10-CM

## 2024-09-13 DIAGNOSIS — L814 Other melanin hyperpigmentation: Secondary | ICD-10-CM

## 2024-09-13 DIAGNOSIS — L409 Psoriasis, unspecified: Secondary | ICD-10-CM

## 2024-09-13 DIAGNOSIS — Z1589 Genetic susceptibility to other disease: Secondary | ICD-10-CM | POA: Insufficient documentation

## 2024-09-13 DIAGNOSIS — M3500 Sicca syndrome, unspecified: Secondary | ICD-10-CM | POA: Insufficient documentation

## 2024-09-13 DIAGNOSIS — M792 Neuralgia and neuritis, unspecified: Secondary | ICD-10-CM | POA: Insufficient documentation

## 2024-09-13 DIAGNOSIS — M2550 Pain in unspecified joint: Secondary | ICD-10-CM | POA: Insufficient documentation

## 2024-09-13 DIAGNOSIS — Z1283 Encounter for screening for malignant neoplasm of skin: Secondary | ICD-10-CM

## 2024-09-13 DIAGNOSIS — D229 Melanocytic nevi, unspecified: Secondary | ICD-10-CM

## 2024-09-13 DIAGNOSIS — M170 Bilateral primary osteoarthritis of knee: Secondary | ICD-10-CM | POA: Insufficient documentation

## 2024-09-13 DIAGNOSIS — W908XXA Exposure to other nonionizing radiation, initial encounter: Secondary | ICD-10-CM | POA: Diagnosis not present

## 2024-09-13 DIAGNOSIS — L578 Other skin changes due to chronic exposure to nonionizing radiation: Secondary | ICD-10-CM

## 2024-09-13 DIAGNOSIS — M150 Primary generalized (osteo)arthritis: Secondary | ICD-10-CM | POA: Insufficient documentation

## 2024-09-13 NOTE — Progress Notes (Signed)
 Total Body Skin Exam (TBSE) Visit   Subjective  Annette Larsen is a 53 y.o. female who presents for the following: Skin Cancer Screening and Full Body Skin Exam  Patient presents today for follow up visit for TBSE. Patient was last evaluated on 06/13/2024. Patient denies medication changes. Patient reports she does have spots, moles and lesions of concern to be evaluated. Patient reports throughout her lifetime she has had minimal sun exposure. Currently, patient reports if she has excessive sun exposure, she does apply sunscreen and/or wears protective coverings. Patient reports she has hx of bx. Patient denies  family history of skin cancers.   For Psoriasis: Patient was last seen on 06/13/24. At this visit she was prescribed Zoryve  foam and Taltz. Prior- Auth for Rosalio is currently pending. She feels she has some improvement with Zoryve  but still is experiencing joint pains and Psoriasis flares. She feels her diagnosis of Psoriasis is negatively impacting her quality of life and self-esteem.  Patient has previously tried and failed the following medications: - Clobetasol ointment 0.05% 02/2020 - Current - Ketoconazole Shampoo 2% 08/2017 - 11/2017 - Fluocinonide 0.05% Solution 09/2014 - 09/2021 - Hydrocortisone  2.5% cream 12/2016 - 02/2020 - Betamethasone  Ointment 02/2019 - 01/2022 - Derma Smoothe Scalp Oil (Fluocinolone) 0.01% 02/2020 - Current - Zoryve  0.3% Foam 06/2024 - Current  Patient provided verbal consent for the use of an AI-assisted program to generate a detailed after-visit summary. The patient understands that the AI tool is used to support clinical documentation and that all information will be reviewed and verified by the healthcare provider.  The following portions of the chart were reviewed this encounter and updated as appropriate: medications, allergies, medical history  Review of Systems:  No other skin or systemic complaints except as noted in HPI or Assessment and  Plan.  Objective  Well appearing patient in no apparent distress; mood and affect are within normal limits.  A full examination was performed including scalp, head, eyes, ears, nose, lips, neck, chest, axillae, abdomen, back, buttocks, bilateral upper extremities, bilateral lower extremities, hands, feet, fingers, toes, fingernails, and toenails. All findings within normal limits unless otherwise noted below.   Relevant physical exam findings are noted in the Assessment and Plan.    Assessment & Plan   LENTIGINES, SEBORRHEIC KERATOSES, HEMANGIOMAS - Benign normal skin lesions - Benign-appearing - Call for any changes  MELANOCYTIC NEVI - Tan-brown and/or pink-flesh-colored symmetric macules and papules - Benign appearing on exam today - Observation - Call clinic for new or changing moles - Recommend daily use of broad spectrum spf 30+ sunscreen to sun-exposed areas.  - Call for new or changing lesions.  PSORIASIS Exam: Well-demarcated erythematous papules/plaques with silvery scale, guttate pink scaly papules. 10% BSA, IGA 2  Not at goal  Patient reports joint pain.  Chronic plaque psoriasis with joint involvement, primarily affecting the chest, scalp, and ears. Recent improvement with Zoryve  foam, but scalp remains inconsistent. Awaiting Taltz approval for systemic treatment. Topical treatments provide temporary relief, while systemic treatment is necessary for long-term clearance.  Treatment Plan: -- Continue Zoryve  foam daily until Taltz approval is obtained. - Await Taltz approval for systemic treatment.  Pt is not a candidate for methotrexate or cyclosporine due to inability for follow up labs and contraindications with other medications. Pt has no access to a light box for phototherapy.  Due to the progressive and chronic nature of her psoriasis, patient has tried and failed numerous topicals creams as noted above, the next  best therapeutic option is a systemic therapy.  It is medically necessary to help improve her quality of life.   SKIN CANCER SCREENING PERFORMED TODAY.   Return in about 2 years (around 09/13/2026) for TBSE.  I, Jetta Ager, am acting as neurosurgeon for [employer], DO.  Documentation: I have reviewed the above documentation for accuracy and completeness, and I agree with the above.  Delon Lenis, DO

## 2024-09-13 NOTE — Patient Instructions (Signed)

## 2024-10-18 ENCOUNTER — Encounter: Payer: Self-pay | Admitting: Dermatology

## 2024-10-18 ENCOUNTER — Ambulatory Visit: Admitting: Dermatology

## 2024-10-18 DIAGNOSIS — L409 Psoriasis, unspecified: Secondary | ICD-10-CM | POA: Diagnosis not present

## 2024-10-18 DIAGNOSIS — L83 Acanthosis nigricans: Secondary | ICD-10-CM

## 2024-10-18 MED ORDER — IXEKIZUMAB 80 MG/ML ~~LOC~~ SOAJ
160.0000 mg | Freq: Once | SUBCUTANEOUS | Status: AC
  Administered 2024-10-18: 14:00:00 160 mg via SUBCUTANEOUS

## 2024-10-18 MED ORDER — CLOBETASOL PROPIONATE 0.05 % EX SOLN: 1.0000 | Freq: Two times a day (BID) | CUTANEOUS | 2 refills | Status: AC

## 2024-10-18 NOTE — Patient Instructions (Addendum)
 Injection schedule:  -   Next injection date will be on December 31st, 2025 -   Plan to inject 1 pen starting on week 4-10 (January 14th, 21st, 28th, February 4th, 11th, 18th & 25th) -   March 25th, 2026 and every then 28 days afterwards       Important Information  Due to recent changes in healthcare laws, you may see results of your pathology and/or laboratory studies on MyChart before the doctors have had a chance to review them. We understand that in some cases there may be results that are confusing or concerning to you. Please understand that not all results are received at the same time and often the doctors may need to interpret multiple results in order to provide you with the best plan of care or course of treatment. Therefore, we ask that you please give us  2 business days to thoroughly review all your results before contacting the office for clarification. Should we see a critical lab result, you will be contacted sooner.   If You Need Anything After Your Visit  If you have any questions or concerns for your doctor, please call our main line at [phone] If no one answers, please leave a voicemail as directed and we will return your call as soon as possible. Messages left after 4 pm will be answered the following business day.   You may also send us  a message via MyChart. We typically respond to MyChart messages within 1-2 business days.  For prescription refills, please ask your pharmacy to contact our office. Our fax number is [phone].  If you have an urgent issue when the clinic is closed that cannot wait until the next business day, you can page your doctor at the number below.    Please note that while we do our best to be available for urgent issues outside of office hours, we are not available 24/7.   If you have an urgent issue and are unable to reach us , you may choose to seek medical care at your doctor's office, retail clinic, urgent care center, or  emergency room.  If you have a medical emergency, please immediately call 911 or go to the emergency department. In the event of inclement weather, please call our main line at [phone] for an update on the status of any delays or closures.  Dermatology Medication Tips: Please keep the boxes that topical medications come in in order to help keep track of the instructions about where and how to use these. Pharmacies typically print the medication instructions only on the boxes and not directly on the medication tubes.   If your medication is too expensive, please contact our office at [phone] or send us  a message through MyChart.   We are unable to tell what your co-pay for medications will be in advance as this is different depending on your insurance coverage. However, we may be able to find a substitute medication at lower cost or fill out paperwork to get insurance to cover a needed medication.   If a prior authorization is required to get your medication covered by your insurance company, please allow us  1-2 business days to complete this process.  Drug prices often vary depending on where the prescription is filled and some pharmacies may offer cheaper prices.  The website www.goodrx.com contains coupons for medications through different pharmacies. The prices here do not account for what the cost may be with help from insurance (it may be cheaper with your insurance),  but the website can give you the price if you did not use any insurance.  - You can print the associated coupon and take it with your prescription to the pharmacy.  - You may also stop by our office during regular business hours and pick up a GoodRx coupon card.  - If you need your prescription sent electronically to a different pharmacy, notify our office through [facility] or by phone at [phone]

## 2024-10-18 NOTE — Progress Notes (Signed)
° °  Follow-Up Visit  Patient (and/or pt guardian) consented to the use of AI-assisted tools for note generation.    Subjective  Annette Larsen is a 53 y.o. female who presents for the following: Taltz  injection training for Psoriasis  Patient was last evaluated on 09/13/24.  At this visit patient was advised to continue use of Zoryve  until Taltz  medication is approved  Patient reports sxs are unchanged.  Patient denies medication changes. Patient reports area still flares and skin still feels thick in that area and is sometimes dry and flaky. Patient reports she is using Zoryve  twice daily every day   Patient reports joints in hands feel stiff  The following portions of the chart were reviewed this encounter and updated as appropriate: medications, allergies, medical history  Review of Systems:  No other skin or systemic complaints except as noted in HPI or Assessment and Plan.  Objective  Well appearing patient in no apparent distress; mood and affect are within normal limits.  A focused examination was performed of the following areas: Chest, scalp and ears  Relevant exam findings are noted in the Assessment and Plan.   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 Current treatment with Taltz  injections every two weeks until March, then monthly. Taltz  expected to clear skin within four weeks. Topical treatments like Zoryve  provide limited relief. Discussed potential immune suppression with Taltz  and increased risk of infections, especially in close proximity settings. Advised on flu vaccination timing and precautions during travel.  - Continue Taltz  injections every two weeks until March, then monthly. - Prescribed clobetasol  liquid drops for scalp and ear application. - Advised mixing clobetasol  with Aquaphor or Vaseline for application. - Discussed flu vaccination timing and precautions during travel. - Advised wearing a mask in close  proximity settings to reduce infection risk.  Acanthosis nigricans Presence of acanthosis nigricans, indicating potential insulin resistance and weight gain. Discussed the importance of diet and exercise in managing this condition. Recommended glycolic acid toner for exfoliation and skin lightening. - Recommended ordinary glycolic acid toner for nighttime use. - Advised on diet and exercise modifications to manage weight and insulin resistance.   INJECTION TRAINING - Patient instructed how to complete Taltz  Injections with Demonstration Pen - Patient completed Injections while in office at [facility], Mild erythema noted at injection site.   - Advised to continue topicals for break through Psoriasis Flares (Zoryve  0.3% Foam) - Patient tolerated well and demonstrated understanding - Educated the next injection date will be on December 31st, 2025.  - After 2nd loading dose administered, Plan to inject 1 pen starting on week 4-10 (January 14th, 21st, 28th, February 4th, 11th, 18th & 25th) - The first Maintenance dose should be administered on March 25th, 2026 and every 28 days afterwards - Plan to follow up in 4 months   Taltz  NDC: 9997-8554-88 LOT: [id number] CJ EXP: 12/05/2025   PSORIASIS    Return in about 9 months (around 07/19/2025) for Psoriasis F/U.  LILLETTE Lyle Cords, am acting as a neurosurgeon for [employer], DO .   Documentation: I have reviewed the above documentation for accuracy and completeness, and I agree with the above.  Delon Lenis, DO

## 2024-11-08 NOTE — Telephone Encounter (Signed)
 Please let pt know that it looks like a normal injection site reaction.  As long at the redness is not spreading and that she does not experience any fever or chills then it's a normal reaction and not an infection.  She should make sure that the next injection is in a different area on the thigh or in the abdomin.  Thanks!

## 2025-04-12 ENCOUNTER — Ambulatory Visit: Admitting: Dermatology
